# Patient Record
Sex: Female | Born: 1985
Health system: Southern US, Community
[De-identification: ages and names within clinical notes are randomized; demographics above are authoritative.]

## PROBLEM LIST (undated history)

## (undated) DIAGNOSIS — H539 Unspecified visual disturbance: Secondary | ICD-10-CM

## (undated) DIAGNOSIS — G35D Multiple sclerosis, unspecified: Secondary | ICD-10-CM

## (undated) DIAGNOSIS — R519 Headache, unspecified: Secondary | ICD-10-CM

## (undated) DIAGNOSIS — R51 Headache: Secondary | ICD-10-CM

## (undated) DIAGNOSIS — G35 Multiple sclerosis: Secondary | ICD-10-CM

## (undated) DIAGNOSIS — I10 Essential (primary) hypertension: Secondary | ICD-10-CM

## (undated) HISTORY — DX: Unspecified visual disturbance: H53.9

## (undated) HISTORY — DX: Multiple sclerosis, unspecified: G35.D

## (undated) HISTORY — DX: Headache, unspecified: R51.9

## (undated) HISTORY — DX: Headache: R51

## (undated) HISTORY — PX: CERVICAL CONE BIOPSY: SUR198

## (undated) HISTORY — DX: Multiple sclerosis: G35

---
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
## 2005-06-06 ENCOUNTER — Other Ambulatory Visit: Admission: RE | Admit: 2005-06-06 | Discharge: 2005-06-06 | Payer: Self-pay | Admitting: Family Medicine

## 2006-06-01 ENCOUNTER — Other Ambulatory Visit: Admission: RE | Admit: 2006-06-01 | Discharge: 2006-06-01 | Payer: Self-pay | Admitting: Family Medicine

## 2007-06-03 ENCOUNTER — Other Ambulatory Visit: Admission: RE | Admit: 2007-06-03 | Discharge: 2007-06-03 | Payer: Self-pay | Admitting: Family Medicine

## 2008-08-31 ENCOUNTER — Other Ambulatory Visit: Admission: RE | Admit: 2008-08-31 | Discharge: 2008-08-31 | Payer: Self-pay | Admitting: Family Medicine

## 2008-09-17 ENCOUNTER — Emergency Department (HOSPITAL_COMMUNITY): Admission: EM | Admit: 2008-09-17 | Discharge: 2008-09-17 | Payer: Self-pay | Admitting: Family Medicine

## 2008-11-22 ENCOUNTER — Emergency Department (HOSPITAL_COMMUNITY): Admission: EM | Admit: 2008-11-22 | Discharge: 2008-11-22 | Payer: Self-pay | Admitting: Emergency Medicine

## 2009-09-07 ENCOUNTER — Other Ambulatory Visit
Admission: RE | Admit: 2009-09-07 | Discharge: 2009-09-07 | Payer: Self-pay | Source: Home / Self Care | Admitting: Family Medicine

## 2010-02-16 ENCOUNTER — Inpatient Hospital Stay (HOSPITAL_COMMUNITY)
Admission: AD | Admit: 2010-02-16 | Discharge: 2010-02-18 | Payer: Self-pay | Source: Home / Self Care | Attending: Obstetrics and Gynecology | Admitting: Obstetrics and Gynecology

## 2010-02-16 ENCOUNTER — Encounter (INDEPENDENT_AMBULATORY_CARE_PROVIDER_SITE_OTHER): Payer: Self-pay | Admitting: Obstetrics and Gynecology

## 2010-02-18 ENCOUNTER — Encounter
Admission: RE | Admit: 2010-02-18 | Discharge: 2010-03-19 | Payer: Self-pay | Source: Home / Self Care | Attending: Obstetrics and Gynecology | Admitting: Obstetrics and Gynecology

## 2010-04-29 LAB — CBC
Platelets: 221 10*3/uL (ref 150–400)
Platelets: 235 10*3/uL (ref 150–400)
RBC: 4.18 MIL/uL (ref 3.87–5.11)
RBC: 4.59 MIL/uL (ref 3.87–5.11)
RDW: 14 % (ref 11.5–15.5)
WBC: 12.9 10*3/uL — ABNORMAL HIGH (ref 4.0–10.5)
WBC: 17 10*3/uL — ABNORMAL HIGH (ref 4.0–10.5)

## 2010-04-29 LAB — URINE MICROSCOPIC-ADD ON

## 2010-04-29 LAB — URINALYSIS, ROUTINE W REFLEX MICROSCOPIC
Bilirubin Urine: NEGATIVE
Glucose, UA: NEGATIVE mg/dL
Protein, ur: NEGATIVE mg/dL
Specific Gravity, Urine: 1.02 (ref 1.005–1.030)

## 2010-05-13 ENCOUNTER — Ambulatory Visit (HOSPITAL_COMMUNITY)
Admission: RE | Admit: 2010-05-13 | Discharge: 2010-05-13 | Disposition: A | Discharge status: Home / Self Care | Payer: Self-pay | Source: Ambulatory Visit | Attending: Infectious Diseases | Admitting: Infectious Diseases

## 2010-05-13 DIAGNOSIS — A4902 Methicillin resistant Staphylococcus aureus infection, unspecified site: Secondary | ICD-10-CM | POA: Insufficient documentation

## 2010-05-13 LAB — SURGICAL PCR SCREEN: Staphylococcus aureus: POSITIVE — AB

## 2010-10-16 ENCOUNTER — Other Ambulatory Visit: Payer: Self-pay | Admitting: Obstetrics and Gynecology

## 2010-10-16 ENCOUNTER — Other Ambulatory Visit (HOSPITAL_COMMUNITY)
Admission: RE | Admit: 2010-10-16 | Discharge: 2010-10-16 | Disposition: A | Discharge status: Home / Self Care | Payer: Federal, State, Local not specified - PPO | Source: Ambulatory Visit | Attending: Obstetrics and Gynecology | Admitting: Obstetrics and Gynecology

## 2010-10-16 DIAGNOSIS — Z113 Encounter for screening for infections with a predominantly sexual mode of transmission: Secondary | ICD-10-CM | POA: Insufficient documentation

## 2010-10-16 DIAGNOSIS — Z01419 Encounter for gynecological examination (general) (routine) without abnormal findings: Secondary | ICD-10-CM | POA: Insufficient documentation

## 2011-11-07 ENCOUNTER — Other Ambulatory Visit: Payer: Self-pay | Admitting: Obstetrics and Gynecology

## 2011-11-07 ENCOUNTER — Other Ambulatory Visit (HOSPITAL_COMMUNITY)
Admission: RE | Admit: 2011-11-07 | Discharge: 2011-11-07 | Disposition: A | Discharge status: Home / Self Care | Payer: Federal, State, Local not specified - PPO | Source: Ambulatory Visit | Attending: Obstetrics and Gynecology | Admitting: Obstetrics and Gynecology

## 2011-11-07 DIAGNOSIS — Z01419 Encounter for gynecological examination (general) (routine) without abnormal findings: Secondary | ICD-10-CM | POA: Insufficient documentation

## 2012-11-12 ENCOUNTER — Other Ambulatory Visit (HOSPITAL_COMMUNITY)
Admission: RE | Admit: 2012-11-12 | Discharge: 2012-11-12 | Disposition: A | Discharge status: Home / Self Care | Payer: BC Managed Care – PPO | Source: Ambulatory Visit | Attending: Family Medicine | Admitting: Family Medicine

## 2012-11-12 ENCOUNTER — Other Ambulatory Visit: Payer: Self-pay | Admitting: Physician Assistant

## 2012-11-12 DIAGNOSIS — Z124 Encounter for screening for malignant neoplasm of cervix: Secondary | ICD-10-CM | POA: Insufficient documentation

## 2014-09-19 ENCOUNTER — Other Ambulatory Visit: Payer: Self-pay | Admitting: Obstetrics and Gynecology

## 2014-09-19 ENCOUNTER — Other Ambulatory Visit (HOSPITAL_COMMUNITY)
Admission: RE | Admit: 2014-09-19 | Discharge: 2014-09-19 | Disposition: A | Discharge status: Home / Self Care | Payer: BC Managed Care – PPO | Source: Ambulatory Visit | Attending: Obstetrics and Gynecology | Admitting: Obstetrics and Gynecology

## 2014-09-19 DIAGNOSIS — Z113 Encounter for screening for infections with a predominantly sexual mode of transmission: Secondary | ICD-10-CM | POA: Insufficient documentation

## 2014-09-19 DIAGNOSIS — Z01411 Encounter for gynecological examination (general) (routine) with abnormal findings: Secondary | ICD-10-CM | POA: Diagnosis not present

## 2014-09-20 LAB — CYTOLOGY - PAP

## 2015-08-03 ENCOUNTER — Other Ambulatory Visit (HOSPITAL_COMMUNITY): Payer: Self-pay | Admitting: Obstetrics and Gynecology

## 2015-08-03 DIAGNOSIS — O3680X0 Pregnancy with inconclusive fetal viability, not applicable or unspecified: Secondary | ICD-10-CM

## 2015-08-16 ENCOUNTER — Ambulatory Visit (HOSPITAL_COMMUNITY): Payer: BC Managed Care – PPO

## 2015-08-17 ENCOUNTER — Ambulatory Visit (HOSPITAL_COMMUNITY)
Admission: RE | Admit: 2015-08-17 | Discharge: 2015-08-17 | Disposition: A | Discharge status: Home / Self Care | Payer: BC Managed Care – PPO | Source: Ambulatory Visit | Attending: Obstetrics and Gynecology | Admitting: Obstetrics and Gynecology

## 2015-08-17 DIAGNOSIS — Z3A01 Less than 8 weeks gestation of pregnancy: Secondary | ICD-10-CM | POA: Diagnosis not present

## 2015-08-17 DIAGNOSIS — Z8759 Personal history of other complications of pregnancy, childbirth and the puerperium: Secondary | ICD-10-CM | POA: Diagnosis not present

## 2015-08-17 DIAGNOSIS — Z36 Encounter for antenatal screening of mother: Secondary | ICD-10-CM | POA: Insufficient documentation

## 2015-08-17 DIAGNOSIS — O3680X0 Pregnancy with inconclusive fetal viability, not applicable or unspecified: Secondary | ICD-10-CM | POA: Insufficient documentation

## 2017-03-20 ENCOUNTER — Ambulatory Visit
Admission: RE | Admit: 2017-03-20 | Discharge: 2017-03-20 | Disposition: A | Discharge status: Home / Self Care | Payer: BC Managed Care – PPO | Source: Ambulatory Visit | Attending: Physician Assistant | Admitting: Physician Assistant

## 2017-03-20 ENCOUNTER — Other Ambulatory Visit: Payer: Self-pay | Admitting: Physician Assistant

## 2017-03-20 DIAGNOSIS — R51 Headache: Principal | ICD-10-CM

## 2017-03-20 DIAGNOSIS — R519 Headache, unspecified: Secondary | ICD-10-CM

## 2017-03-31 ENCOUNTER — Other Ambulatory Visit: Payer: Self-pay | Admitting: Obstetrics and Gynecology

## 2017-03-31 ENCOUNTER — Other Ambulatory Visit (HOSPITAL_COMMUNITY)
Admission: RE | Admit: 2017-03-31 | Discharge: 2017-03-31 | Disposition: A | Discharge status: Home / Self Care | Payer: BC Managed Care – PPO | Source: Ambulatory Visit | Attending: Obstetrics and Gynecology | Admitting: Obstetrics and Gynecology

## 2017-03-31 ENCOUNTER — Encounter: Payer: Self-pay | Admitting: Neurology

## 2017-03-31 DIAGNOSIS — Z124 Encounter for screening for malignant neoplasm of cervix: Secondary | ICD-10-CM | POA: Insufficient documentation

## 2017-04-01 LAB — CYTOLOGY - PAP
Diagnosis: NEGATIVE
HPV (WINDOPATH): NOT DETECTED

## 2017-07-15 ENCOUNTER — Encounter

## 2017-07-15 ENCOUNTER — Ambulatory Visit: Payer: BC Managed Care – PPO | Admitting: Neurology

## 2017-07-15 DIAGNOSIS — G35A Relapsing-remitting multiple sclerosis: Secondary | ICD-10-CM | POA: Insufficient documentation

## 2017-07-15 DIAGNOSIS — G35 Multiple sclerosis: Secondary | ICD-10-CM | POA: Insufficient documentation

## 2017-10-01 ENCOUNTER — Encounter: Payer: Self-pay | Admitting: Neurology

## 2017-10-01 ENCOUNTER — Other Ambulatory Visit: Payer: Self-pay

## 2017-10-01 ENCOUNTER — Ambulatory Visit: Payer: BC Managed Care – PPO | Admitting: Neurology

## 2017-10-01 VITALS — BP 146/98 | HR 73 | Resp 14 | Ht 62.0 in | Wt 154.0 lb

## 2017-10-01 DIAGNOSIS — G35 Multiple sclerosis: Secondary | ICD-10-CM | POA: Diagnosis not present

## 2017-10-01 DIAGNOSIS — F09 Unspecified mental disorder due to known physiological condition: Secondary | ICD-10-CM | POA: Diagnosis not present

## 2017-10-01 DIAGNOSIS — R3915 Urgency of urination: Secondary | ICD-10-CM

## 2017-10-01 DIAGNOSIS — R269 Unspecified abnormalities of gait and mobility: Secondary | ICD-10-CM | POA: Insufficient documentation

## 2017-10-01 DIAGNOSIS — G35D Multiple sclerosis, unspecified: Secondary | ICD-10-CM | POA: Insufficient documentation

## 2017-10-01 MED ORDER — AMPHETAMINE-DEXTROAMPHET ER 15 MG PO CP24: 15.0000 mg | ORAL_CAPSULE | ORAL | 0 refills | Status: DC

## 2017-10-01 NOTE — Progress Notes (Signed)
[facility]  PATIENT: Marissa Andersen DOB: 07-27-1985  REFERRING DOCTOR OR PCP:  Dr. Barrie Folk SOURCE: Patient, notes from Dr. Nestor Ramp, imaging and lab reports, MRI images personally reviewed on PACS.  _________________________________   HISTORICAL  CHIEF COMPLAINT:  Chief Complaint  Patient presents with  . Multiple Sclerosis    Marissa is here for a 2nd opinion regarding MS and tx. options.  Currently seeing Dr. Nestor Ramp at [facility].  "I just want to make sure I'm on the right track."  Sts. dx. 07/09/17.  Presenting sx. was h/a's, dizziness, optic neuritis, right sided facial numbness.  MRI brain, c-spine and t-spine done. No LP was needed. She had 5 days of IV Solumedrol, then started Ocrevus. She had parts A and B of 1st infusion in June 2019.  Facial numbness has resolved. Sts. vision is back to baseline.  She now only c/o   . Fatigue    fatigue.  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  No fam hx. of MS/fim    HISTORY OF PRESENT ILLNESS:  I had the pleasure seeing patient, Marissa Andersen, at the [place] center at  [facility] for neurologic consultation regarding her recent diagnosis of multiple sclerosis.    She is a 32 year old woman who was diagnosed with MS in May 2019  She woke up with a severe headache in January that did not improve.    Then she had dizziness and nausea in January and February 2019.    Her smile seemed distorted and she had riht facial numbness in February 2019.    Around March, she noted difficulty with vision out of the right eye followed by diplopia. In [place] PCP first started to treat her for migraine.   When symptoms persisted, she had a head CT which was read as normal (in retrospect, there is one focus on the left).  At some point, she noted increased urinary frequency,  She saw Dr. Nestor Ramp May 2019.   An MRI was ordered in late May 2019 and she was diagnosed with MS based on the  pattern and that multiple lesions enhanced.   She did feel clumsy and fall once in June or July.    She received 5 days of IV Solumedrol in late MAy, early June.     She was prescribed Ocrevus and had the split dose 08/13/2017 and 08/28/2017.      She feels gait is doing well and is back to baseline.   Strength is back to baseline but she notes she fatigue more quickly climbing stairs.    She denies numbness.   Bladder function is better but she still notes urgency.   No recent incontinence.   Vision is fine now.    Diplopia resolved.    She has had a lot of fatigue the past few months.   She doesn't sleep well some nights when her husband is out of town (travels every week).    Mood is doing ok.  She denies depression but has been sad a few time.   She feels cognition is worse this year with reduced focus/attention and spme verbal fluency issues.    She is otherwise fairly healthy with benign essential hypertension treated with losartan.   She has no FH of MS.    I personally reviewed the MRIs of the brain and spine performed 07/09/2017 and 07/28/2017.  The MRI of the brain shows multiple T2/FLAIR hyperintense foci in  the hemispheres as well in the posterior pons.  About 7-8 of the lesions were enhancing after contrast.  The largest focus is in the left parietal lobe.    MRI of the cervical and thoracic spine also showed foci.  Located at C2, T1, T5 and T11.  None of them and enhanced.   She also had lab work in May and June 2019.  The JCV antibody was positive at 1.56.  The neuromyelitis optica antibody was negative.  REVIEW OF SYSTEMS: Constitutional: No fevers, chills, sweats, or change in appetite.   She has some fatigue. Eyes: No visual changes, double vision, eye pain Ear, nose and throat: No hearing loss, ear pain, nasal congestion, sore throat Cardiovascular: No chest pain, palpitations Respiratory: No shortness of breath at rest or with exertion.   No wheezes GastrointestinaI: No nausea,  vomiting, diarrhea, abdominal pain, fecal incontinence Genitourinary:She has urinary urgency and frequency.  No incontinence.   Musculoskeletal: No neck pain, back pain Integumentary: No rash, pruritus, skin lesions Neurological: as above Psychiatric: No depression at this time but has felt a little sad since her diagnosis..  No anxiety Endocrine: No palpitations, diaphoresis, change in appetite, change in weigh or increased thirst Hematologic/Lymphatic: No anemia, purpura, petechiae. Allergic/Immunologic: No itchy/runny eyes, nasal congestion, recent allergic reactions, rashes  ALLERGIES: No Known Allergies  HOME MEDICATIONS:  Current Outpatient Medications:  .  losartan (COZAAR) 25 MG tablet, Take 25 mg by mouth daily., Disp: , Rfl:  .  ocrelizumab 600 mg in sodium chloride 0.9 % 500 mL, Inject 600 mg into the vein every 6 (six) months., Disp: , Rfl:  .  amphetamine-dextroamphetamine (ADDERALL XR) 15 MG 24 hr capsule, Take 1 capsule by mouth every morning., Disp: 30 capsule, Rfl: 0  PAST MEDICAL HISTORY: Past Medical History:  Diagnosis Date  . Headache   . Multiple sclerosis ([place])   . Vision abnormalities     PAST SURGICAL HISTORY: Past Surgical History:  Procedure Laterality Date  . CERVICAL CONE BIOPSY      FAMILY HISTORY: Family History  Problem Relation Age of Onset  . Hypertension Mother   . Diabetes type II Mother   . Multiple myeloma Mother   . Hypertension Father   . Healthy Sister   . Healthy Brother   . Diabetes Maternal Grandmother   . Diabetes Maternal Grandfather   . Hypertension Paternal Grandmother   . Dementia Paternal Grandmother     SOCIAL HISTORY:  Social History   Socioeconomic History  . Marital status: Married    Spouse name: Not on file  . Number of children: Not on file  . Years of education: Not on file  . Highest education level: Not on file  Occupational History  . Not on file  Social Needs  . Financial resource strain:  Not on file  . Food insecurity:    Worry: Not on file    Inability: Not on file  . Transportation needs:    Medical: Not on file    Non-medical: Not on file  Tobacco Use  . Smoking status: Former Research scientist (life sciences)  . Smokeless tobacco: Never Used  Substance and Sexual Activity  . Alcohol use: Not Currently  . Drug use: Never  . Sexual activity: Not on file  Lifestyle  . Physical activity:    Days per week: Not on file    Minutes per session: Not on file  . Stress: Not on file  Relationships  . Social connections:    Talks on  phone: Not on file    Gets together: Not on file    Attends religious service: Not on file    Active member of club or organization: Not on file    Attends meetings of clubs or organizations: Not on file    Relationship status: Not on file  . Intimate partner violence:    Fear of current or ex partner: Not on file    Emotionally abused: Not on file    Physically abused: Not on file    Forced sexual activity: Not on file  Other Topics Concern  . Not on file  Social History Narrative  . Not on file     PHYSICAL EXAM  Vitals:   10/01/17 1345  BP: (!) 146/98  Pulse: 73  Resp: 14  Weight: 154 lb (69.9 kg)  Height: '5\' 2"'  (1.575 m)    Body mass index is 28.17 kg/m.   General: The patient is well-developed and well-nourished and in no acute distress  Eyes:  Funduscopic exam shows normal optic discs and retinal vessels.  Neck: The neck is supple, no carotid bruits are noted.  The neck is nontender.  Cardiovascular: The heart has a regular rate and rhythm with a normal S1 and S2. There were no murmurs, gallops or rubs. Lungs are clear to auscultation.  Skin: Extremities are without significant edema.  Musculoskeletal:  Back is nontender  Neurologic Exam  Mental status: The patient is alert and oriented x 3 at the time of the examination. The patient has apparent normal recent and remote memory, with an apparently normal attention span and  concentration ability.   Speech is normal.  Cranial nerves: Extraocular movements are full. Pupils are equal, round, and reactive to light and accomodation.  Visual fields are full.  Facial symmetry is present. There is good facial sensation to soft touch bilaterally.Facial strength is normal.  Trapezius and sternocleidomastoid strength is normal. No dysarthria is noted.  The tongue is midline, and the patient has symmetric elevation of the soft palate. No obvious hearing deficits are noted.  Motor:  Muscle bulk is normal.   Tone is normal. Strength is  5 / 5 in all 4 extremities.   Sensory: Sensory testing is intact to pinprick, soft touch and vibration sensation in all 4 extremities.  Coordination: Cerebellar testing reveals good finger-nose-finger and heel-to-shin bilaterally.  Gait and station: Station is normal.   Gait is normal. Tandem gait is mildly wide. Romberg is negative.   Reflexes: Deep tendon reflexes are symmetric and normal bilaterally.   Plantar responses are flexor.    DIAGNOSTIC DATA (LABS, IMAGING, TESTING) - I reviewed patient records, labs, notes, testing and imaging myself where available.  Lab Results  Component Value Date   WBC 12.9 (H) 02/17/2010   HGB 11.6 (L) 02/17/2010   HCT 35.0 (L) 02/17/2010   MCV 83.7 02/17/2010   PLT 221 02/17/2010      ASSESSMENT AND PLAN  Multiple sclerosis (HCC)  Cognitive deficit secondary to multiple sclerosis (HCC)  Gait disturbance  Urinary urgency  In summary, [name] is a 32 year old woman with a recent diagnosis of MS.  I discussed with her that the MRI shows that she does have fairly aggressive features.  Specifically, she had multiple enhancing lesions at presentation and has 4 spinal cord plaques and one large plaque in the brainstem.  Fortunately she has recovered fairly well and has only mild deficits at this current time.  I concur with the use of Ocrevus  as a highly effective disease modifying therapy for MS  and I reviewed the safety issues with her again.  She wishes to change over to our practice and her next infusion will be in the second half of December.  Symptomatically, she is noting a fair amount of fatigue and some mild cognitive issues including reduced attention.  I will send in a prescription for Adderall and we can adjust the dose as needed.  She will return to see me in December or call sooner if there are new or worsening neurologic symptoms.  Thank you for asking me to see [name].  Please let me know if I can be of further assistance with her or other patients in the future.   Richard A. Felecia Shelling, MD, PhD, FAAN Certified in Neurology, Clinical Neurophysiology, Sleep Medicine, Pain Medicine and Neuroimaging Director, [place] [facility] [address], [place], [place] [address] [phone]

## 2017-10-02 ENCOUNTER — Encounter: Payer: Self-pay | Admitting: *Deleted

## 2017-10-05 ENCOUNTER — Telehealth: Payer: Self-pay | Admitting: *Deleted

## 2017-10-05 NOTE — Telephone Encounter (Signed)
Fax received from [facility], phone# [phone].   Adderall PA approved for dates 10/05/17 thru 10/05/20.  PA# [facility] Plan [phone] non-Grandfathered [id number] DH/fim

## 2017-10-05 NOTE — Telephone Encounter (Signed)
PA for Amphetamine-Dextroamphetamne ER 15mg  #30/30 completed via Cover My Meds. Key# [id number].  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

## 2017-11-06 ENCOUNTER — Encounter (HOSPITAL_COMMUNITY): Payer: Self-pay | Admitting: Emergency Medicine

## 2017-11-06 ENCOUNTER — Emergency Department (HOSPITAL_COMMUNITY): Payer: BC Managed Care – PPO

## 2017-11-06 ENCOUNTER — Emergency Department (HOSPITAL_COMMUNITY)
Admission: EM | Admit: 2017-11-06 | Discharge: 2017-11-06 | Disposition: A | Discharge status: Home / Self Care | Payer: BC Managed Care – PPO | Attending: Emergency Medicine | Admitting: Emergency Medicine

## 2017-11-06 ENCOUNTER — Other Ambulatory Visit: Payer: Self-pay

## 2017-11-06 DIAGNOSIS — F419 Anxiety disorder, unspecified: Secondary | ICD-10-CM | POA: Diagnosis not present

## 2017-11-06 DIAGNOSIS — R002 Palpitations: Secondary | ICD-10-CM

## 2017-11-06 DIAGNOSIS — Z87891 Personal history of nicotine dependence: Secondary | ICD-10-CM | POA: Diagnosis not present

## 2017-11-06 LAB — I-STAT CHEM 8, ED
BUN: 8 mg/dL (ref 6–20)
CREATININE: 0.8 mg/dL (ref 0.44–1.00)
Calcium, Ion: 1.14 mmol/L — ABNORMAL LOW (ref 1.15–1.40)
Chloride: 103 mmol/L (ref 98–111)
GLUCOSE: 91 mg/dL (ref 70–99)
HEMATOCRIT: 43 % (ref 36.0–46.0)
HEMOGLOBIN: 14.6 g/dL (ref 12.0–15.0)
Potassium: 3.1 mmol/L — ABNORMAL LOW (ref 3.5–5.1)
Sodium: 140 mmol/L (ref 135–145)
TCO2: 25 mmol/L (ref 22–32)

## 2017-11-06 LAB — CBC WITH DIFFERENTIAL/PLATELET
Abs Immature Granulocytes: 0 10*3/uL (ref 0.0–0.1)
BASOS PCT: 1 %
Basophils Absolute: 0.1 10*3/uL (ref 0.0–0.1)
EOS ABS: 0.2 10*3/uL (ref 0.0–0.7)
EOS PCT: 3 %
HEMATOCRIT: 43.7 % (ref 36.0–46.0)
Hemoglobin: 13.9 g/dL (ref 12.0–15.0)
Immature Granulocytes: 0 %
Lymphocytes Relative: 27 %
Lymphs Abs: 1.5 10*3/uL (ref 0.7–4.0)
MCH: 27.4 pg (ref 26.0–34.0)
MCHC: 31.8 g/dL (ref 30.0–36.0)
MCV: 86 fL (ref 78.0–100.0)
Monocytes Absolute: 0.6 10*3/uL (ref 0.1–1.0)
Monocytes Relative: 11 %
NEUTROS PCT: 58 %
Neutro Abs: 3.2 10*3/uL (ref 1.7–7.7)
PLATELETS: 288 10*3/uL (ref 150–400)
RBC: 5.08 MIL/uL (ref 3.87–5.11)
RDW: 11.9 % (ref 11.5–15.5)
WBC: 5.5 10*3/uL (ref 4.0–10.5)

## 2017-11-06 LAB — I-STAT TROPONIN, ED
TROPONIN I, POC: 0 ng/mL (ref 0.00–0.08)
Troponin i, poc: 0 ng/mL (ref 0.00–0.08)

## 2017-11-06 LAB — I-STAT BETA HCG BLOOD, ED (MC, WL, AP ONLY)

## 2017-11-06 LAB — D-DIMER, QUANTITATIVE (NOT AT ARMC)

## 2017-11-06 NOTE — ED Triage Notes (Signed)
Pt to ED via EMS from home. 45 mins ago began having chest tightness that she describes as dull pain, worse with inhalation. Hx anxiety, diagnosed with MS in May. Sts she has never had panic attack, but pain has subsided, heart rate and blood pressure are back WDL at present whereas pt was hypertensive and tachycardic on EMS arrival. 324 ASA given PTA.

## 2017-11-06 NOTE — ED Provider Notes (Signed)
[place] EMERGENCY DEPARTMENT Provider Note   CSN: [id number] Arrival date & time: 11/06/17  0204     History   Chief Complaint Chief Complaint  Patient presents with  . Chest Pain    HPI Marissa Andersen is a 32 y.o. female.  The history is provided by the patient. No language interpreter was used.  Chest Pain   This is a new problem. The current episode started 1 to 2 hours ago. The problem occurs constantly. The problem has not changed since onset.Associated with: being upset  The pain is present in the substernal region. The pain is moderate. The quality of the pain is described as dull. The pain does not radiate. Associated symptoms include palpitations. Pertinent negatives include no abdominal pain, no cough, no diaphoresis, no syncope and no vomiting. She has tried nothing for the symptoms. The treatment provided significant relief. There are no known risk factors.  Pertinent negatives for past medical history include no MI.  Pertinent negatives for family medical history include: no Marfan's syndrome.  Procedure history is negative for cardiac catheterization.  Was having a discussion with her husband regarding medical issues and began having palpitations CP sob.  Those resolved.    Past Medical History:  Diagnosis Date  . Headache   . Multiple sclerosis ([place])   . Vision abnormalities     Patient Active Problem List   Diagnosis Date Noted  . 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 Gait disturbance 10/01/2017  . Multiple sclerosis ([place]) 07/15/2017    Past Surgical History:  Procedure Laterality Date  . CERVICAL CONE BIOPSY       OB History   None      Home Medications    Prior to Admission medications   Medication Sig Start Date End Date Taking? Authorizing Provider  amphetamine-dextroamphetamine (ADDERALL XR) 15 MG 24 hr capsule Take 1 capsule by mouth every morning. Patient not taking: Reported on  11/06/2017 10/01/17   Britt Bottom, MD    Family History Family History  Problem Relation Age of Onset  . Hypertension Mother   . Diabetes type II Mother   . Multiple myeloma Mother   . Hypertension Father   . Healthy Sister   . Healthy Brother   . Diabetes Maternal Grandmother   . Diabetes Maternal Grandfather   . Hypertension Paternal Grandmother   . Dementia Paternal Grandmother     Social History Social History   Tobacco Use  . Smoking status: Former Research scientist (life sciences)  . Smokeless tobacco: Never Used  Substance Use Topics  . Alcohol use: Not Currently  . Drug use: Never     Allergies   Patient has no known allergies.   Review of Systems Review of Systems  Constitutional: Negative for diaphoresis.  Respiratory: Negative for cough.   Cardiovascular: Positive for chest pain and palpitations. Negative for leg swelling and syncope.  Gastrointestinal: Negative for abdominal pain and vomiting.  Psychiatric/Behavioral: The patient is nervous/anxious.   All other systems reviewed and are negative.    Physical Exam Updated Vital Signs BP (!) 140/92   Pulse 75   Temp (!) 97.5 F (36.4 C) (Oral)   Resp 13   Ht '5\' 2"'  (1.575 m)   Wt 68 kg   SpO2 100%   BMI 27.44 kg/m   Physical Exam  Constitutional: She is oriented to person, place, and time. She appears well-developed and well-nourished. No distress.  HENT:  Head: Normocephalic and atraumatic.  Mouth/Throat: No oropharyngeal exudate.  Eyes: Pupils are equal, round, and reactive to light. Conjunctivae are normal.  Neck: Normal range of motion. Neck supple.  Cardiovascular: Normal rate, regular rhythm, normal heart sounds and intact distal pulses.  Pulmonary/Chest: Effort normal and breath sounds normal. No stridor. She has no wheezes. She has no rales. She exhibits no tenderness.  Abdominal: Soft. Bowel sounds are normal. She exhibits no mass. There is no tenderness. There is no rebound and no guarding.    Musculoskeletal: Normal range of motion.  Neurological: She is alert and oriented to person, place, and time. She displays normal reflexes.  Skin: Skin is warm and dry. Capillary refill takes less than 2 seconds.  Psychiatric: She has a normal mood and affect.     ED Treatments / Results  Labs (all labs ordered are listed, but only abnormal results are displayed) Results for orders placed or performed during the hospital encounter of 11/06/17  CBC with Differential/Platelet  Result Value Ref Range   WBC 5.5 4.0 - 10.5 K/uL   RBC 5.08 3.87 - 5.11 MIL/uL   Hemoglobin 13.9 12.0 - 15.0 g/dL   HCT 43.7 36.0 - 46.0 %   MCV 86.0 78.0 - 100.0 fL   MCH 27.4 26.0 - 34.0 pg   MCHC 31.8 30.0 - 36.0 g/dL   RDW 11.9 11.5 - 15.5 %   Platelets 288 150 - 400 K/uL   Neutrophils Relative % 58 %   Neutro Abs 3.2 1.7 - 7.7 K/uL   Lymphocytes Relative 27 %   Lymphs Abs 1.5 0.7 - 4.0 K/uL   Monocytes Relative 11 %   Monocytes Absolute 0.6 0.1 - 1.0 K/uL   Eosinophils Relative 3 %   Eosinophils Absolute 0.2 0.0 - 0.7 K/uL   Basophils Relative 1 %   Basophils Absolute 0.1 0.0 - 0.1 K/uL   Immature Granulocytes 0 %   Abs Immature Granulocytes 0.0 0.0 - 0.1 K/uL  D-dimer, quantitative (not at [facility])  Result Value Ref Range   D-Dimer, Quant <0.27 0.00 - 0.50 ug/mL-FEU  I-stat troponin, ED  Result Value Ref Range   Troponin i, poc 0.00 0.00 - 0.08 ng/mL   Comment 3          I-Stat beta hCG blood, ED  Result Value Ref Range   I-stat hCG, quantitative <5.0 <5 mIU/mL   Comment 3          I-Stat Chem 8, ED  Result Value Ref Range   Sodium 140 135 - 145 mmol/L   Potassium 3.1 (L) 3.5 - 5.1 mmol/L   Chloride 103 98 - 111 mmol/L   BUN 8 6 - 20 mg/dL   Creatinine, Ser 0.80 0.44 - 1.00 mg/dL   Glucose, Bld 91 70 - 99 mg/dL   Calcium, Ion 1.14 (L) 1.15 - 1.40 mmol/L   TCO2 25 22 - 32 mmol/L   Hemoglobin 14.6 12.0 - 15.0 g/dL   HCT 43.0 36.0 - 46.0 %  I-Stat Troponin, ED (not at [facility])  Result Value  Ref Range   Troponin i, poc 0.00 0.00 - 0.08 ng/mL   Comment 3           Dg Chest 2 View  Result Date: 11/06/2017 CLINICAL DATA:  Chest tightness for 45 minutes. Recent diagnosis of multiple sclerosis in May. EXAM: CHEST - 2 VIEW COMPARISON:  None. FINDINGS: The heart size and mediastinal contours are within normal limits. Both lungs are clear. The visualized skeletal structures are  unremarkable. IMPRESSION: No active cardiopulmonary disease. Electronically Signed   By: Lucienne Capers M.D.   On: 11/06/2017 02:43    EKG EKG Interpretation  Date/Time:  Friday November 06 2017 02:15:48 EDT Ventricular Rate:  76 PR Interval:    QRS Duration: 90 QT Interval:  369 QTC Calculation: 415 R Axis:   74 Text Interpretation:  Sinus rhythm Baseline wander in lead(s) V3 Otherwise within normal limits No old tracing to compare Confirmed by Delora Fuel ([id number]) on 11/06/2017 2:19:40 AM   Radiology Dg Chest 2 View  Result Date: 11/06/2017 CLINICAL DATA:  Chest tightness for 45 minutes. Recent diagnosis of multiple sclerosis in May. EXAM: CHEST - 2 VIEW COMPARISON:  None. FINDINGS: The heart size and mediastinal contours are within normal limits. Both lungs are clear. The visualized skeletal structures are unremarkable. IMPRESSION: No active cardiopulmonary disease. Electronically Signed   By: Lucienne Capers M.D.   On: 11/06/2017 02:43    Procedures Procedures (including critical care time)    Final Clinical Impressions(s) / ED Diagnoses   Symptoms consistent with panic attack.  Ruled out for MI and PE in the ED.    Return for fevers >100.4 unrelieved by medication, shortness of breath, intractable vomiting, or diarrhea, Inability to tolerate liquids or food, cough, altered mental status or any concerns. No signs of systemic illness or infection. The patient is nontoxic-appearing on exam and vital signs are within normal limits.   I have reviewed the triage vital signs and the nursing  notes. Pertinent labs &imaging results that were available during my care of the patient were reviewed by me and considered in my medical decision making (see chart for details).  After history, exam, and medical workup I feel the patient has been appropriately medically screened and is safe for discharge home. Pertinent diagnoses were discussed with the patient. Patient was given return precautions.      Jere Vanburen, MD 11/06/17 [id number]

## 2017-11-26 ENCOUNTER — Telehealth: Payer: Self-pay | Admitting: Neurology

## 2017-11-26 NOTE — Telephone Encounter (Signed)
Spoke with Marissa Andersen and explained she can have a flu shot--dead vaccine only--and should get the vaccine at least 6 weeks prior to her Ocrevus infusion.  It will not hurt her if she has the vaccine w/i 6 wks of Ocrevus, but the Ocrevus will render the vaccine less effective.  She verbalized understanding of same/fim

## 2017-11-26 NOTE — Telephone Encounter (Signed)
Pt requesting a call stating she is unsure if/when she can get her flu shot. She does not want it to interfere with infusions. Please advise

## 2017-11-30 ENCOUNTER — Other Ambulatory Visit: Payer: Self-pay | Admitting: Physician Assistant

## 2017-11-30 ENCOUNTER — Other Ambulatory Visit (HOSPITAL_COMMUNITY)
Admission: RE | Admit: 2017-11-30 | Discharge: 2017-11-30 | Disposition: A | Discharge status: Home / Self Care | Payer: BC Managed Care – PPO | Source: Ambulatory Visit | Attending: Family Medicine | Admitting: Family Medicine

## 2017-11-30 DIAGNOSIS — Z01419 Encounter for gynecological examination (general) (routine) without abnormal findings: Secondary | ICD-10-CM | POA: Insufficient documentation

## 2017-12-01 ENCOUNTER — Telehealth: Payer: Self-pay | Admitting: Neurology

## 2017-12-02 LAB — CYTOLOGY - PAP
ADEQUACY: ABSENT
DIAGNOSIS: NEGATIVE

## 2017-12-16 ENCOUNTER — Telehealth: Payer: Self-pay | Admitting: *Deleted

## 2017-12-16 NOTE — Telephone Encounter (Signed)
[place] MR BRAIN W & WO CONTRAST5/23/2019 [facility] Result Narrative  CLINICAL DATA:Rule out demyelinating disease.  EXAM: MRI HEAD WITHOUT AND WITH CONTRAST  TECHNIQUE: Multiplanar, multiecho pulse sequences of the brain and surrounding structures were obtained without and with intravenous contrast.  CONTRAST:13 mL MultiHance IV  COMPARISON:CT head 03/20/2017  FINDINGS: Brain: Multiple periventricular white matter lesions are present bilaterally. Many of these are perpendicular to the ventricular surface consistent with multiple sclerosis. Hyperintense signal in the floor of the fourth ventricle.  Postcontrast, multiple white matter lesions show enhancement. The enhancement pattern is that of partial ring-like enhancement, some lesion show mild enhancement others more intense enhancement. T2 shine through and multiple lesions without restricted diffusion.  Ventricle size is normal.Negative for hemorrhage.No mass lesion.  Vascular: Normal arterial flow voids.  Skull and upper cervical spine: Negative  Sinuses/Orbits: Mucous retention cyst left maxillary sinus. Normal orbit  Other: None  IMPRESSION: Numerous medium size periventricular white matter hyperintense lesions many which show partial enhancement consistent with multiple sclerosis. Hyperintense signal in the midbrain at the floor of the fourth ventricle also consistent with multiple sclerosis.   Electronically Signed ByMaye Hides M.D. On: 07/09/2017 15:21  Other Result Information  Interface, Rad Results In - 07/09/2017  3:23 PM EDT CLINICAL DATA:  Rule out demyelinating disease.  EXAM: MRI HEAD WITHOUT AND WITH CONTRAST  TECHNIQUE: Multiplanar, multiecho pulse sequences of the brain and surrounding structures were obtained without and with intravenous contrast.  CONTRAST:  13 mL MultiHance IV  COMPARISON:  CT head 03/20/2017  FINDINGS: Brain: Multiple  periventricular white matter lesions are present bilaterally. Many of these are perpendicular to the ventricular surface consistent with multiple sclerosis. Hyperintense signal in the floor of the fourth ventricle.  Postcontrast, multiple white matter lesions show enhancement. The enhancement pattern is that of partial ring-like enhancement, some lesion show mild enhancement others more intense enhancement. T2 shine through and multiple lesions without restricted diffusion.  Ventricle size is normal.  Negative for hemorrhage.  No mass lesion.  Vascular: Normal arterial flow voids.  Skull and upper cervical spine: Negative  Sinuses/Orbits: Mucous retention cyst left maxillary sinus. Normal orbit  Other: None  IMPRESSION: Numerous medium size periventricular white matter hyperintense lesions many which show partial enhancement consistent with multiple sclerosis. Hyperintense signal in the midbrain at the floor of the fourth ventricle also consistent

## 2017-12-16 NOTE — Telephone Encounter (Signed)
Hepatitis B Surface Antibody, QL    07/15/2017 07/15/2017   NONREACTIVE     HEP-B Core AB  07/15/2017  Non-Reactive  Hepatitis C Antibody  5/29/201Non-Reactive9  Hepatitis A (IGG and IGM) Antibody5/29/2019  Non-Reactive  Hepatitis B Surface Ant  5/29/2019Non-Reactive

## 2017-12-22 NOTE — Telephone Encounter (Signed)
Error

## 2018-02-05 ENCOUNTER — Encounter: Payer: Self-pay | Admitting: Neurology

## 2018-02-05 ENCOUNTER — Other Ambulatory Visit: Payer: Self-pay

## 2018-02-05 ENCOUNTER — Ambulatory Visit: Payer: BC Managed Care – PPO | Admitting: Neurology

## 2018-02-05 VITALS — BP 110/80 | HR 72 | Ht 62.0 in | Wt 153.5 lb

## 2018-02-05 DIAGNOSIS — F419 Anxiety disorder, unspecified: Secondary | ICD-10-CM

## 2018-02-05 DIAGNOSIS — G35 Multiple sclerosis: Secondary | ICD-10-CM | POA: Diagnosis not present

## 2018-02-05 DIAGNOSIS — F09 Unspecified mental disorder due to known physiological condition: Secondary | ICD-10-CM

## 2018-02-05 DIAGNOSIS — R269 Unspecified abnormalities of gait and mobility: Secondary | ICD-10-CM | POA: Diagnosis not present

## 2018-02-05 DIAGNOSIS — Z79899 Other long term (current) drug therapy: Secondary | ICD-10-CM

## 2018-02-05 MED ORDER — INDOMETHACIN 25 MG PO CAPS: 25.0000 mg | ORAL_CAPSULE | Freq: Two times a day (BID) | ORAL | 1 refills | Status: DC

## 2018-02-05 NOTE — Progress Notes (Signed)
[facility]  PATIENT: Marissa Andersen DOB: 1985/05/22  REFERRING DOCTOR OR PCP:  Dr. Barrie Folk SOURCE: Patient, notes from Dr. Nestor Ramp, imaging and lab reports, MRI images personally reviewed on PACS.  _________________________________   HISTORICAL  CHIEF COMPLAINT:  Chief Complaint  Patient presents with  . Follow-up    RM 12, alone. Last seen 10/01/17. Stopped adderall after taking for a week d/t anxiety attack  . Multiple Sclerosis    On Ocrevus. Next infusion: 02/11/18  . Headache    Headaches have come back in the last week. Has eletriptan to take prn from PCP. They gave her 6 tabs per month but she is needing to take more than this. She wants to discuss headaches.   . Joint Pain    Has achy joints, both knees. She lives on third floor.     HISTORY OF PRESENT ILLNESS:  Marissa Andersen is a 32 y.o. woman with multiple sclerosis.      Update 02/05/2018: Her first Ocrevus infusions were in late June and early July 2019.  She tolerated the infusions well.  She is scheduled to have her next infusion in about a week.  She denies any exacerbations.    Currently, her gait is doing well.  Strength returned to baseline from her initial exacerbation.  She fatigues easily.   She feels the legs are slightly numb.  She has some bladder urgency but no incontinence.  She does not note any difficulties with her vision.  She had some diplopia that resolved.  I had prescribed Adderall for fatigue and cognitive followed.  She was unable to tolerate it due to feeling more anxious and having a panic attack a few days after starting   She notes myalgias ans knee and back aches.   She notes anxiety in general more than depression.   She haas had only the one panic attack.   She reports migraine headaches the last 2 weeks but notes more stress with husband out of town most of that time.   HA is bilateral.   Pain is pressure like,  She has mild nausea but no vomiting.   No photophobia.      Moving does not change the intensity.    She was prescribed eletriptan and took 2 the one day with benefit and one yesterday with benefit.      From 10/01/2017: She is a 32 year old woman who was diagnosed with MS in May 2019.  She woke up with a severe headache in January that did not improve.    Then she had dizziness and nausea in January and February 2019.    Her smile seemed distorted and she had riht facial numbness in February 2019.    Around March, she noted difficulty with vision out of the right eye followed by diplopia. In [place] PCP first started to treat her for migraine.   When symptoms persisted, she had a head CT which was read as normal (in retrospect, there is one focus on the left).  At some point, she noted increased urinary frequency,  She saw Dr. Nestor Ramp May 2019.   An MRI was ordered in late May 2019 and she was diagnosed with MS based on the pattern and that multiple lesions enhanced.   She did feel clumsy and fall once in June or July.    She received 5 days of IV Solumedrol in late [place]y, early June.     She was prescribed Ocrevus and had the split dose 08/13/2017  and 08/28/2017.      She feels gait is doing well and is back to baseline.   Strength is back to baseline but she notes she fatigue more quickly climbing stairs.    She denies numbness.   Bladder function is better but she still notes urgency.   No recent incontinence.   Vision is fine now.    Diplopia resolved.    She has had a lot of fatigue the past few months.   She doesn't sleep well some nights when her husband is out of town (travels every week).    Mood is doing ok.  She denies depression but has been sad a few time.   She feels cognition is worse this year with reduced focus/attention and spme verbal fluency issues.    She is otherwise fairly healthy with benign essential hypertension treated with losartan.   She has no FH of MS.    I personally reviewed the MRIs of the brain and spine performed 07/09/2017 and  07/28/2017.  The MRI of the brain shows multiple T2/FLAIR hyperintense foci in the hemispheres as well in the posterior pons.  About 7-8 of the lesions were enhancing after contrast.  The largest focus is in the left parietal lobe.    MRI of the cervical and thoracic spine also showed foci.  Located at C2, T1, T5 and T11.  None of them and enhanced.   She also had lab work in May and June 2019.  The JCV antibody was positive at 1.56.  The neuromyelitis optica antibody was negative.  REVIEW OF SYSTEMS: Constitutional: No fevers, chills, sweats, or change in appetite.   She has some fatigue. Eyes: No visual changes, double vision, eye pain Ear, nose and throat: No hearing loss, ear pain, nasal congestion, sore throat Cardiovascular: No chest pain, palpitations Respiratory: No shortness of breath at rest or with exertion.   No wheezes GastrointestinaI: No nausea, vomiting, diarrhea, abdominal pain, fecal incontinence Genitourinary:She has urinary urgency and frequency.  No incontinence.   Musculoskeletal: No neck pain, back pain Integumentary: No rash, pruritus, skin lesions Neurological: as above Psychiatric: No depression at this time but has felt a little sad since her diagnosis..  No anxiety Endocrine: No palpitations, diaphoresis, change in appetite, change in weigh or increased thirst Hematologic/Lymphatic: No anemia, purpura, petechiae. Allergic/Immunologic: No itchy/runny eyes, nasal congestion, recent allergic reactions, rashes  ALLERGIES: Allergies  Allergen Reactions  . Adderall [Amphetamine-Dextroamphetamine]     Pt feels it caused anxiety attack    HOME MEDICATIONS:  Current Outpatient Medications:  .  eletriptan (RELPAX) 40 MG tablet, Take 40 mg by mouth as needed for migraine or headache. May repeat in 2 hours if headache persists or recurs., Disp: , Rfl:  .  ocrelizumab 600 mg in sodium chloride 0.9 % 500 mL, Inject 600 mg into the vein once., Disp: , Rfl:  .   indomethacin (INDOCIN) 25 MG capsule, Take 1 capsule (25 mg total) by mouth 2 (two) times daily with a meal., Disp: 30 capsule, Rfl: 1  PAST MEDICAL HISTORY: Past Medical History:  Diagnosis Date  . Headache   . Multiple sclerosis ([place])   . Vision abnormalities     PAST SURGICAL HISTORY: Past Surgical History:  Procedure Laterality Date  . CERVICAL CONE BIOPSY      FAMILY HISTORY: Family History  Problem Relation Age of Onset  . Hypertension Mother   . Diabetes type II Mother   . Multiple myeloma Mother   .  Hypertension Father   . Healthy Sister   . Healthy Brother   . Diabetes Maternal Grandmother   . Diabetes Maternal Grandfather   . Hypertension Paternal Grandmother   . Dementia Paternal Grandmother     SOCIAL HISTORY:  Social History   Socioeconomic History  . Marital status: Married    Spouse name: Not on file  . Number of children: Not on file  . Years of education: Not on file  . Highest education level: Not on file  Occupational History  . Not on file  Social Needs  . Financial resource strain: Not on file  . Food insecurity:    Worry: Not on file    Inability: Not on file  . Transportation needs:    Medical: Not on file    Non-medical: Not on file  Tobacco Use  . Smoking status: Former Research scientist (life sciences)  . Smokeless tobacco: Never Used  Substance and Sexual Activity  . Alcohol use: Not Currently  . Drug use: Never  . Sexual activity: Not on file  Lifestyle  . Physical activity:    Days per week: Not on file    Minutes per session: Not on file  . Stress: Not on file  Relationships  . Social connections:    Talks on phone: Not on file    Gets together: Not on file    Attends religious service: Not on file    Active member of club or organization: Not on file    Attends meetings of clubs or organizations: Not on file    Relationship status: Not on file  . Intimate partner violence:    Fear of current or ex partner: Not on file    Emotionally abused:  Not on file    Physically abused: Not on file    Forced sexual activity: Not on file  Other Topics Concern  . Not on file  Social History Narrative  . Not on file     PHYSICAL EXAM  Vitals:   02/05/18 0835  BP: 110/80  Pulse: 72  SpO2: 98%  Weight: 153 lb 8 oz (69.6 kg)  Height: '5\' 2"'  (1.575 m)    Body mass index is 28.08 kg/m.   General: The patient is well-developed and well-nourished and in no acute distress  Eyes:  Funduscopic exam shows normal optic discs and retinal vessels.  Neck: The neck is supple, no carotid bruits are noted.  The neck is nontender.  Cardiovascular: The heart has a regular rate and rhythm with a normal S1 and S2. There were no murmurs, gallops or rubs. Lungs are clear to auscultation.  Skin: Extremities are without significant edema.  Musculoskeletal:  Back is nontender  Neurologic Exam  Mental status: The patient is alert and oriented x 3 at the time of the examination. The patient has apparent normal recent and remote memory, with an apparently normal attention span and concentration ability.   Speech is normal.  Cranial nerves: Extraocular movements are full. Pupils are equal, round, and reactive to light and accomodation.  Visual fields are full.  Facial symmetry is present. There is good facial sensation to soft touch bilaterally.Facial strength is normal.  Trapezius and sternocleidomastoid strength is normal. No dysarthria is noted.  The tongue is midline, and the patient has symmetric elevation of the soft palate. No obvious hearing deficits are noted.  Motor:  Muscle bulk is normal.   Tone is normal. Strength is  5 / 5 in all 4 extremities.   Sensory:  Sensory testing is intact to pinprick, soft touch and vibration sensation in all 4 extremities.  Coordination: Cerebellar testing reveals good finger-nose-finger and heel-to-shin bilaterally.  Gait and station: Station is normal.   Gait is normal.  The tandem gait is mildly wide.   Romberg is negative.   Reflexes: Deep tendon reflexes are symmetric and normal bilaterally.     DIAGNOSTIC DATA (LABS, IMAGING, TESTING) - I reviewed patient records, labs, notes, testing and imaging myself where available.  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    Continue Ocrevus.   Check IgG/IgM and CBC 2.   For her headaches I will send in indomethacin to take as needed up to twice a day.  However, if headaches have not resolved in 2 weeks she should give [place] a call back and I will consider zonisamide on imipramine to help with prophylaxis. 3.    Stay active and exercise as tolerated. 4.    She will return in 6 months for regular visit.  We will need to check an MRI of the brain at that time to determine if there is any subclinical progression   Donnis Pecha A. Felecia Shelling, MD, PhD, FAAN Certified in Neurology, Clinical Neurophysiology, Sleep Medicine, Pain Medicine and Neuroimaging Director, [place] [facility] [address], [place], [place] [address] [phone]

## 2018-02-06 LAB — CBC WITH DIFFERENTIAL/PLATELET
BASOS ABS: 0.1 10*3/uL (ref 0.0–0.2)
Basos: 1 %
EOS (ABSOLUTE): 0.2 10*3/uL (ref 0.0–0.4)
Eos: 5 %
Hematocrit: 39.1 % (ref 34.0–46.6)
Hemoglobin: 12.9 g/dL (ref 11.1–15.9)
Immature Grans (Abs): 0 10*3/uL (ref 0.0–0.1)
Immature Granulocytes: 0 %
LYMPHS ABS: 1.3 10*3/uL (ref 0.7–3.1)
Lymphs: 28 %
MCH: 27.4 pg (ref 26.6–33.0)
MCHC: 33 g/dL (ref 31.5–35.7)
MCV: 83 fL (ref 79–97)
MONOS ABS: 0.3 10*3/uL (ref 0.1–0.9)
Monocytes: 7 %
Neutrophils Absolute: 2.8 10*3/uL (ref 1.4–7.0)
Neutrophils: 59 %
PLATELETS: 314 10*3/uL (ref 150–450)
RBC: 4.71 x10E6/uL (ref 3.77–5.28)
RDW: 12.3 % (ref 12.3–15.4)
WBC: 4.7 10*3/uL (ref 3.4–10.8)

## 2018-02-06 LAB — IGG, IGA, IGM
IGA/IMMUNOGLOBULIN A, SERUM: 121 mg/dL (ref 87–352)
IGG (IMMUNOGLOBIN G), SERUM: 1341 mg/dL (ref 700–1600)
IgM (Immunoglobulin M), Srm: 47 mg/dL (ref 26–217)

## 2018-02-08 ENCOUNTER — Telehealth: Payer: Self-pay | Admitting: Neurology

## 2018-02-08 ENCOUNTER — Telehealth: Payer: Self-pay | Admitting: *Deleted

## 2018-02-08 NOTE — Telephone Encounter (Signed)
lvm for pt to call back about scheduling mri. Patient needs to be scheduled in June before her follow up appointment with Dr. Epimenio Foot

## 2018-02-08 NOTE — Telephone Encounter (Signed)
-----   Message from Asa Lenteichard A Sater, MD sent at 02/08/2018  3:29 PM EST ----- Please let the patient know that the lab work is fine.

## 2018-02-08 NOTE — Telephone Encounter (Signed)
Called and spoke with pt. Relayed lab work is fine per Dr. Epimenio Foot. She verbalized understanding.

## 2018-04-13 ENCOUNTER — Other Ambulatory Visit: Payer: Self-pay

## 2018-04-13 ENCOUNTER — Emergency Department (HOSPITAL_COMMUNITY): Payer: BC Managed Care – PPO

## 2018-04-13 ENCOUNTER — Emergency Department (HOSPITAL_COMMUNITY)
Admission: EM | Admit: 2018-04-13 | Discharge: 2018-04-14 | Disposition: A | Discharge status: Home / Self Care | Payer: BC Managed Care – PPO | Attending: Emergency Medicine | Admitting: Emergency Medicine

## 2018-04-13 ENCOUNTER — Encounter (HOSPITAL_COMMUNITY): Payer: Self-pay | Admitting: Emergency Medicine

## 2018-04-13 DIAGNOSIS — I10 Essential (primary) hypertension: Secondary | ICD-10-CM | POA: Insufficient documentation

## 2018-04-13 DIAGNOSIS — R072 Precordial pain: Secondary | ICD-10-CM | POA: Insufficient documentation

## 2018-04-13 DIAGNOSIS — R002 Palpitations: Secondary | ICD-10-CM | POA: Diagnosis not present

## 2018-04-13 DIAGNOSIS — Z87891 Personal history of nicotine dependence: Secondary | ICD-10-CM | POA: Insufficient documentation

## 2018-04-13 DIAGNOSIS — R079 Chest pain, unspecified: Secondary | ICD-10-CM | POA: Diagnosis present

## 2018-04-13 HISTORY — DX: Essential (primary) hypertension: I10

## 2018-04-13 LAB — BASIC METABOLIC PANEL
Anion gap: 7 (ref 5–15)
BUN: 7 mg/dL (ref 6–20)
CHLORIDE: 107 mmol/L (ref 98–111)
CO2: 25 mmol/L (ref 22–32)
CREATININE: 0.8 mg/dL (ref 0.44–1.00)
Calcium: 8.9 mg/dL (ref 8.9–10.3)
GFR calc Af Amer: >60 mL/min (ref >60)
GFR calc non Af Amer: >60 mL/min (ref >60)
GLUCOSE: 90 mg/dL (ref 70–99)
POTASSIUM: 3.5 mmol/L (ref 3.5–5.1)
SODIUM: 139 mmol/L (ref 135–145)

## 2018-04-13 LAB — CBC
HEMATOCRIT: 41.3 % (ref 36.0–46.0)
HEMOGLOBIN: 13 g/dL (ref 12.0–15.0)
MCH: 26.6 pg (ref 26.0–34.0)
MCHC: 31.5 g/dL (ref 30.0–36.0)
MCV: 84.5 fL (ref 80.0–100.0)
Platelets: 269 10*3/uL (ref 150–400)
RBC: 4.89 MIL/uL (ref 3.87–5.11)
RDW: 12.4 % (ref 11.5–15.5)
WBC: 6.1 10*3/uL (ref 4.0–10.5)
nRBC: 0 % (ref 0.0–0.2)

## 2018-04-13 LAB — I-STAT BETA HCG BLOOD, ED (MC, WL, AP ONLY): I-stat hCG, quantitative: <5 m[IU]/mL (ref <5)

## 2018-04-13 LAB — I-STAT TROPONIN, ED: Troponin i, poc: 0 ng/mL (ref 0.00–0.08)

## 2018-04-13 MED ORDER — SODIUM CHLORIDE 0.9% FLUSH: 3.0000 mL | Freq: Once | INTRAVENOUS | Status: DC

## 2018-04-13 NOTE — ED Triage Notes (Signed)
Pt reports substernal chest discomfort, shortness of breath and burning sensation intermittently since Sunday. Denis n/v/d. NAD.

## 2018-04-14 NOTE — Discharge Instructions (Addendum)

## 2018-04-14 NOTE — ED Provider Notes (Signed)
[facility] EMERGENCY DEPARTMENT Provider Note   CSN: [id number] Arrival date & time: 04/13/18  2028    History   Chief Complaint Chief Complaint  Patient presents with  . Chest Pain    HPI Marissa Andersen is a 33 y.o. female.     The history is provided by the patient.  Chest Pain  Pain location:  Substernal area Pain quality: pressure   Pain radiates to:  Does not radiate Pain severity:  Moderate Onset quality:  Gradual Timing:  Intermittent Progression:  Improving Chronicity:  New Relieved by:  None tried Worsened by:  Deep breathing Associated symptoms: palpitations and shortness of breath   Associated symptoms: no cough, no fever, no syncope and no vomiting   Associated symptoms comment:  Denies hemoptysis  Risk factors: hypertension   Risk factors: no birth control, no coronary artery disease, no immobilization, not pregnant, no prior DVT/PE and no smoking   Patient reports she has had substernal chest pain intermittently for the past 2 days.  It comes and goes at random, but at times is slightly worse with breathing. No Fever/vomiting.  She reports shortness of breath.  No cough or hemoptysis. She also reports feeling that her heart is racing.  No syncope. She had similar episode last fall. She has history of MS with ocrevus infusions, no recent issues She reports h/o HTN and recent increase in meds  fam hx - + for CAD Soc hx - nonsmoker, no drug abuse Past Medical History:  Diagnosis Date  . Headache   . Hypertension   . Multiple sclerosis ([place])   . Vision abnormalities     Patient Active Problem List   Diagnosis Date Noted  . High risk medication use 02/05/2018  . Anxiety 02/05/2018  . 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 Gait disturbance 10/01/2017  . Multiple sclerosis ([place]) 07/15/2017    Past Surgical History:  Procedure Laterality Date  . CERVICAL CONE BIOPSY       OB History   No  obstetric history on file.      Home Medications    Prior to Admission medications   Medication Sig Start Date End Date Taking? Authorizing Provider  losartan (COZAAR) 25 MG tablet Take 50 mg by mouth daily. 04/12/18  Yes [provider]  indomethacin (INDOCIN) 25 MG capsule Take 1 capsule (25 mg total) by mouth 2 (two) times daily with a meal. Patient not taking: Reported on 04/14/2018 02/05/18   Britt Bottom, MD    Family History Family History  Problem Relation Age of Onset  . Hypertension Mother   . Diabetes type II Mother   . Multiple myeloma Mother   . Hypertension Father   . Healthy Sister   . Healthy Brother   . Diabetes Maternal Grandmother   . Diabetes Maternal Grandfather   . Hypertension Paternal Grandmother   . Dementia Paternal Grandmother     Social History Social History   Tobacco Use  . Smoking status: Former Research scientist (life sciences)  . Smokeless tobacco: Never Used  Substance Use Topics  . Alcohol use: Not Currently  . Drug use: Never     Allergies   Adderall [amphetamine-dextroamphetamine]   Review of Systems Review of Systems  Constitutional: Negative for fever.  Respiratory: Positive for shortness of breath. Negative for cough.   Cardiovascular: Positive for chest pain and palpitations. Negative for syncope.  Gastrointestinal: Negative for vomiting.  Neurological: Negative for syncope.  All other systems reviewed  and are negative.    Physical Exam Updated Vital Signs BP (!) 138/94 (BP Location: Right Arm)   Pulse 82   Temp 98.2 F (36.8 C) (Oral)   Resp 16   LMP 03/24/2018   SpO2 98%   Physical Exam CONSTITUTIONAL: Well developed/well nourished HEAD: Normocephalic/atraumatic EYES: EOMI/PERRL ENMT: Mucous membranes moist NECK: supple no meningeal signs SPINE/BACK:entire spine nontender CV: S1/S2 noted, no murmurs/rubs/gallops noted LUNGS: Lungs are clear to auscultation bilaterally, no apparent distress ABDOMEN: soft, nontender,  no rebound or guarding, bowel sounds noted throughout abdomen GU:no cva tenderness NEURO: Pt is awake/alert/appropriate, moves all extremitiesx4.  No facial droop.   EXTREMITIES: pulses normal/equal, full ROM, no lower extremity edema or tenderness SKIN: warm, color normal PSYCH: no abnormalities of mood noted, alert and oriented to situation   ED Treatments / Results  Labs (all labs ordered are listed, but only abnormal results are displayed) Labs Reviewed  BASIC METABOLIC PANEL  CBC  I-STAT TROPONIN, ED  I-STAT BETA HCG BLOOD, ED ([facility], [facility], [facility] ONLY)    EKG EKG Interpretation  Date/Time:  Tuesday April 13 2018 20:38:51 EST Ventricular Rate:  82 PR Interval:  174 QRS Duration: 82 QT Interval:  344 QTC Calculation: 401 R Axis:   68 Text Interpretation:  Normal sinus rhythm with sinus arrhythmia Normal ECG No significant change since last tracing Confirmed by Ripley Fraise [phone]) on 04/14/2018 12:29:39 AM   Radiology Dg Chest 2 View  Result Date: 04/13/2018 CLINICAL DATA:  Chest discomfort, shortness of breath EXAM: CHEST - 2 VIEW COMPARISON:  11/06/2017 FINDINGS: Heart and mediastinal contours are within normal limits. No focal opacities or effusions. No acute bony abnormality. IMPRESSION: No active cardiopulmonary disease. Electronically Signed   By: Rolm Baptise M.D.   On: 04/13/2018 21:16    Procedures Procedures   Medications Ordered in ED Medications  sodium chloride flush (NS) 0.9 % injection 3 mL (3 mLs Intravenous Not Given 04/14/18 0020)     Initial Impression / Assessment and Plan / ED Course  I have reviewed the triage vital signs and the nursing notes.  Pertinent labs & imaging results that were available during my care of the patient were reviewed by me and considered in my medical decision making (see chart for details).        Patient well-appearing. Most of her symptoms have resolved, EKG is unchanged, troponin is negative.  She appears PERC  negative. Suspicion for ACS/PE/dissection is low  However, I do feel she should get follow-up for palpitations. She has had episodes recently as well as in the past.  Advise follow-up with her PCP as she may require outpatient monitoring. We discussed strict ER return precautions  Final Clinical Impressions(s) / ED Diagnoses   Final diagnoses:  Precordial pain  Palpitations    ED Discharge Orders    None       Ripley Fraise, MD 04/14/18 [phone]

## 2018-04-22 ENCOUNTER — Telehealth: Payer: Self-pay | Admitting: Neurology

## 2018-04-22 MED ORDER — DICLOFENAC SODIUM 75 MG PO TBEC: DELAYED_RELEASE_TABLET | ORAL | 0 refills | Status: DC

## 2018-04-22 NOTE — Telephone Encounter (Signed)
Pt is having HA's again. She took indomethacin (INDOCIN) 25 MG capsule in December but it made her nauseated. Is there another medication she could try?

## 2018-04-22 NOTE — Telephone Encounter (Signed)
I spoke with Dr. Epimenio Foot verbally in regards to this message. He recommendation was to send Diclofenac 75 mg # 30 0 refill 1 pill bid as needed for headaches. I contacted the pt and she was agreeable to this med/dosage. I have submitted electronically to [facility] on [address]. Pt advised to call back if she had any trouble picking this medication up or if it did not help alleviate her headaches. Pt verbalized understanding/appreciation.

## 2018-04-27 ENCOUNTER — Telehealth: Payer: Self-pay | Admitting: Neurology

## 2018-04-27 NOTE — Telephone Encounter (Signed)
Pt called wondering with this Corona Virus and having MS is she considered high risk and what are the rules with traveling. Please advise.

## 2018-04-28 NOTE — Telephone Encounter (Signed)
I called and spoke with pt. Advised her to take general precautions: washing hands frequently, avoiding large crowds, avoiding touching hands/face. If she becomes symptomatic, she should proceed to PCP to be evaluated asap. Up to her discretion if she travels to [facility] still. She verbalized understanding.

## 2018-05-24 ENCOUNTER — Telehealth: Payer: Self-pay | Admitting: *Deleted

## 2018-05-24 NOTE — Telephone Encounter (Signed)
Late entry: faxed completed/signed [employer] pres[employer]riber [employer] form re: Ocrevus to [phone] on 05/20/18. Received fax confirmation.

## 2018-06-07 NOTE — Telephone Encounter (Signed)
Called, LVM for pt to call office. 

## 2018-06-07 NOTE — Telephone Encounter (Signed)
I called pt back. Relayed Dr. Bonnita Hollow recommendation. She states PCP just prescribed that for her and she has yet to pick up from pharmacy. She will pick up and start this. Reminded her it takes 2-3 weeks for it to build up in her system for her to notice max benefit. She needs to give it time to work. Advised her to call back if she has further questions/concerns.

## 2018-06-07 NOTE — Telephone Encounter (Signed)
Pt called in and stated that she has been short of breath and having tightness in her chest she wants to know if this is a side effect from [employer]

## 2018-06-07 NOTE — Telephone Encounter (Signed)
Dr. Sater- what would you recommend? 

## 2018-06-07 NOTE — Telephone Encounter (Signed)
Called pt back. Chest tightness, SOB started in the last couple weeks. Sx vary by the day. Notices sx more when she is laying down or sitting down. Went to ED a couple times back in January. Advised she had panic attack. Xray completed/normal. Denies cough or any covid-19 sx. Denies any other signs of infection. Denies other sx.  She is wondering if this is MS hug. Last Ocrevus 02/11/2018. Advised I will discuss with Dr. Epimenio Foot and call back.

## 2018-06-07 NOTE — Telephone Encounter (Signed)
This is unlikely to be related to Ocrevus.  Some medications can help if due to panic attacks/anxiety.  We can start Zoloft 50 mg daily (#30 #5) and see if that helps.

## 2018-06-07 NOTE — Telephone Encounter (Signed)
Pt called back. RN not available. Please call back as soon as possible.

## 2018-06-09 ENCOUNTER — Other Ambulatory Visit (HOSPITAL_COMMUNITY)
Admission: RE | Admit: 2018-06-09 | Discharge: 2018-06-09 | Disposition: A | Discharge status: Home / Self Care | Payer: BC Managed Care – PPO | Source: Ambulatory Visit | Attending: Obstetrics and Gynecology | Admitting: Obstetrics and Gynecology

## 2018-06-09 ENCOUNTER — Other Ambulatory Visit: Payer: Self-pay | Admitting: Obstetrics and Gynecology

## 2018-06-09 DIAGNOSIS — N879 Dysplasia of cervix uteri, unspecified: Secondary | ICD-10-CM | POA: Insufficient documentation

## 2018-06-17 ENCOUNTER — Telehealth: Payer: Self-pay | Admitting: *Deleted

## 2018-06-17 NOTE — Telephone Encounter (Signed)
Received fax notification from [employer] that she was approved to receive Ocrevus free of charge. Gave to [facility] for their records.

## 2018-06-22 LAB — CYTOLOGY - PAP: Diagnosis: NEGATIVE

## 2018-07-29 ENCOUNTER — Telehealth: Payer: Self-pay | Admitting: Neurology

## 2018-07-29 NOTE — Telephone Encounter (Signed)
Pt has called asking when is she supposed to hear about the scheduling of her MRI

## 2018-07-29 NOTE — Telephone Encounter (Signed)
Marissa Andersen- can you follow up with her? Looks like you LVM in December to schedule the MRI

## 2018-07-29 NOTE — Telephone Encounter (Signed)
[employer] Auth: [id number] (exp. 07/29/18 to 01/24/19)   I spoke to the patient to schedule her MRI at [facility]. She was going to give me a call back.

## 2018-08-13 ENCOUNTER — Ambulatory Visit: Payer: BC Managed Care – PPO | Admitting: Neurology

## 2018-08-30 NOTE — Telephone Encounter (Signed)
Patient called today her MRI is scheduled for 09/01/18 at [facility].

## 2018-09-01 ENCOUNTER — Ambulatory Visit: Payer: BC Managed Care – PPO

## 2018-09-01 ENCOUNTER — Other Ambulatory Visit: Payer: Self-pay

## 2018-09-01 DIAGNOSIS — G35 Multiple sclerosis: Secondary | ICD-10-CM

## 2018-09-01 MED ORDER — GADOBENATE DIMEGLUMINE 529 MG/ML IV SOLN
14.0000 mL | Freq: Once | INTRAVENOUS | Status: AC | PRN
  Administered 2018-09-01: 14 mL via INTRAVENOUS

## 2018-09-02 ENCOUNTER — Telehealth: Payer: Self-pay | Admitting: *Deleted

## 2018-09-02 NOTE — Telephone Encounter (Signed)
-----   Message from Britt Bottom, MD sent at 09/01/2018  6:29 PM EDT ----- Please let the patient know that the MRI looks good   No new MS lesions

## 2018-09-02 NOTE — Telephone Encounter (Signed)
Called and spoke with pt about MRI results per Dr. Sater note. Pt verbalized understanding. °

## 2018-09-07 DIAGNOSIS — G35 Multiple sclerosis: Secondary | ICD-10-CM | POA: Diagnosis not present

## 2018-09-09 ENCOUNTER — Telehealth: Payer: Self-pay | Admitting: *Deleted

## 2018-09-09 NOTE — Telephone Encounter (Signed)
Pt called and spoke with phone staff. She just had her Ocrevus infusion this week on Tuesday, 09/07/18. Wanting to know if she can go to the dentist today at 10:45am. Advised phone staff to let pt know Dr. Felecia Shelling with a pt. I will ask him and call back to let her know.  Spoke w/ Dr. Felecia Shelling who approved pt to go to dental appt. I called pt back and informed her of this. She verbalized understanding.

## 2018-09-16 ENCOUNTER — Ambulatory Visit: Payer: BC Managed Care – PPO | Admitting: Neurology

## 2018-09-21 ENCOUNTER — Other Ambulatory Visit: Payer: Self-pay

## 2018-09-21 ENCOUNTER — Ambulatory Visit: Payer: BC Managed Care – PPO | Admitting: Neurology

## 2018-09-21 ENCOUNTER — Encounter: Payer: Self-pay | Admitting: Neurology

## 2018-09-21 VITALS — BP 99/67 | HR 78 | Temp 98.6°F | Ht 62.0 in | Wt 149.5 lb

## 2018-09-21 DIAGNOSIS — Z79899 Other long term (current) drug therapy: Secondary | ICD-10-CM

## 2018-09-21 DIAGNOSIS — G35 Multiple sclerosis: Secondary | ICD-10-CM

## 2018-09-21 DIAGNOSIS — R269 Unspecified abnormalities of gait and mobility: Secondary | ICD-10-CM | POA: Diagnosis not present

## 2018-09-21 NOTE — Progress Notes (Addendum)
[facility]  PATIENT: Marissa Andersen DOB: 11-Oct-1985  REFERRING DOCTOR OR PCP:  Dr. Barrie Folk SOURCE: Patient, notes from Dr. Nestor Ramp, imaging and lab reports, MRI images personally reviewed on PACS.  _________________________________   HISTORICAL  CHIEF COMPLAINT:  Chief Complaint  Patient presents with   Follow-up    RM 12. Last seen 02/05/18.    Multiple Sclerosis    On Ocrevus. Last infusion 09/07/2018   Headache    Was on indomethacin but got nauseous 01/2018. Rx switched to diclofenac prn 04/22/18 but pt still taking indomethacin    HISTORY OF PRESENT ILLNESS:  Marissa Andersen is a 33 y.o. woman with relapsing remitting multiple sclerosis.      Update 09/21/2018: She is on Ocrevus as a disease modifying therapy for her relapsing remitting MS.  The last infusion was 09/07/2018.  She tolerates it well.   She feels mostly stablebut notes some mild sensory changes in her left leg.  She has had no major issues.   MRI 08/22/2018 showed no new lesions.  She is walking well and easily goes up and down stairs.    She has some numbness in her thighs.    Limbs are strong.   She notes some pain in her joints/limbs.   She has a little tightness in her muscles.    Bladder function is doing well.    Vision is fine.    She notes fatigue but also has had poor sleep many nights getting 6 hours on average.   She has trouble staying asleep.  She denies depression but has anxiety and was placed on sertraline.   Cognition is doing ok--some word finding errors  She has been experiencing headaches only a couple times a month.  Indomethacin worked well caused GI issues so she was switched to diclofenac.  However, she still takes the indomethacin rather than doclofenac as it is more effective.   If not effective she takes eletriptan if not effective.      Update 02/05/2018: Her first Ocrevus infusions were in late June and early July 2019.  She tolerated the infusions well.  She is  scheduled to have her next infusion in about a week.  She denies any exacerbations.    Currently, her gait is doing well.  Strength returned to baseline from her initial exacerbation.  She fatigues easily.   She feels the legs are slightly numb.  She has some bladder urgency but no incontinence.  She does not note any difficulties with her vision.  She had some diplopia that resolved.  I had prescribed Adderall for fatigue and cognitive followed.  She was unable to tolerate it due to feeling more anxious and having a panic attack a few days after starting   She notes myalgias ans knee and back aches.   She notes anxiety in general more than depression.   She haas had only the one panic attack.   She reports migraine headaches the last 2 weeks but notes more stress with husband out of town most of that time.   HA is bilateral.   Pain is pressure like,  She has mild nausea but no vomiting.   No photophobia.     Moving does not change the intensity.    She was prescribed eletriptan and took 2 the one day with benefit and one yesterday with benefit.      From 10/01/2017: She is a 33 year old woman who was diagnosed with MS in May 2019.  She woke  up with a severe headache in January that did not improve.    Then she had dizziness and nausea in January and February 2019.    Her smile seemed distorted and she had riht facial numbness in February 2019.    Around March, she noted difficulty with vision out of the right eye followed by diplopia. In [place] PCP first started to treat her for migraine.   When symptoms persisted, she had a head CT which was read as normal (in retrospect, there is one focus on the left).  At some point, she noted increased urinary frequency,  She saw Dr. Nestor Ramp May 2019.   An MRI was ordered in late May 2019 and she was diagnosed with MS based on the pattern and that multiple lesions enhanced.   She did feel clumsy and fall once in June or July.    She received 5 days of IV  Solumedrol in late MAy, early June.     She was prescribed Ocrevus and had the split dose 08/13/2017 and 08/28/2017.      She feels gait is doing well and is back to baseline.   Strength is back to baseline but she notes she fatigue more quickly climbing stairs.    She denies numbness.   Bladder function is better but she still notes urgency.   No recent incontinence.   Vision is fine now.    Diplopia resolved.    She has had a lot of fatigue the past few months.   She doesn't sleep well some nights when her husband is out of town (travels every week).    Mood is doing ok.  She denies depression but has been sad a few time.   She feels cognition is worse this year with reduced focus/attention and spme verbal fluency issues.    She is otherwise fairly healthy with benign essential hypertension treated with losartan.   She has no FH of MS.    I personally reviewed the MRIs of the brain and spine performed 07/09/2017 and 07/28/2017.  The MRI of the brain shows multiple T2/FLAIR hyperintense foci in the hemispheres as well in the posterior pons.  About 7-8 of the lesions were enhancing after contrast.  The largest focus is in the left parietal lobe.    MRI of the cervical and thoracic spine also showed foci.  Located at C2, T1, T5 and T11.  None of them and enhanced.   She also had lab work in May and June 2019.  The JCV antibody was positive at 1.56.  The neuromyelitis optica antibody was negative.  REVIEW OF SYSTEMS: Constitutional: No fevers, chills, sweats, or change in appetite.   She has some fatigue. Eyes: No visual changes, double vision, eye pain Ear, nose and throat: No hearing loss, ear pain, nasal congestion, sore throat Cardiovascular: No chest pain, palpitations Respiratory: No shortness of breath at rest or with exertion.   No wheezes GastrointestinaI: No nausea, vomiting, diarrhea, abdominal pain, fecal incontinence Genitourinary:She has urinary urgency and frequency.  No incontinence.     Musculoskeletal: No neck pain, back pain Integumentary: No rash, pruritus, skin lesions Neurological: as above Psychiatric: No depression at this time but has felt a little sad since her diagnosis..  No anxiety Endocrine: No palpitations, diaphoresis, change in appetite, change in weigh or increased thirst Hematologic/Lymphatic: No anemia, purpura, petechiae. Allergic/Immunologic: No itchy/runny eyes, nasal congestion, recent allergic reactions, rashes  ALLERGIES: Allergies  Allergen Reactions   Adderall [Amphetamine-Dextroamphetamine]  Pt feels it caused anxiety attack    HOME MEDICATIONS:  Current Outpatient Medications:    indomethacin (INDOCIN) 25 MG capsule, Take 25 mg by mouth 2 (two) times daily with a meal., Disp: , Rfl:    losartan (COZAAR) 25 MG tablet, Take 50 mg by mouth daily., Disp: , Rfl:    ocrelizumab (OCREVUS) 300 MG/10ML injection, Inject into the vein once., Disp: , Rfl:    diclofenac (VOLTAREN) 75 MG EC tablet, Take 1 tablet two times daily as needed for headaches. (Patient not taking: Reported on 09/21/2018), Disp: 30 tablet, Rfl: 0  PAST MEDICAL HISTORY: Past Medical History:  Diagnosis Date   Headache    Hypertension    Multiple sclerosis ([place])    Vision abnormalities     PAST SURGICAL HISTORY: Past Surgical History:  Procedure Laterality Date   CERVICAL CONE BIOPSY      FAMILY HISTORY: Family History  Problem Relation Age of Onset   Hypertension Mother    Diabetes type II Mother    Multiple myeloma Mother    Hypertension Father    Healthy Sister    Healthy Brother    Diabetes Maternal Grandmother    Diabetes Maternal Grandfather    Hypertension Paternal Grandmother    Dementia Paternal Grandmother     SOCIAL HISTORY:  Social History   Socioeconomic History   Marital status: Married    Spouse name: Not on file   Number of children: Not on file   Years of education: Not on file   Highest education level:  Not on file  Occupational History   Not on file  Social Needs   Financial resource strain: Not on file   Food insecurity    Worry: Not on file    Inability: Not on file   Transportation needs    Medical: Not on file    Non-medical: Not on file  Tobacco Use   Smoking status: Former Smoker   Smokeless tobacco: Never Used  Substance and Sexual Activity   Alcohol use: Not Currently   Drug use: Never   Sexual activity: Not on file  Lifestyle   Physical activity    Days per week: Not on file    Minutes per session: Not on file   Stress: Not on file  Relationships   Social connections    Talks on phone: Not on file    Gets together: Not on file    Attends religious service: Not on file    Active member of club or organization: Not on file    Attends meetings of clubs or organizations: Not on file    Relationship status: Not on file   Intimate partner violence    Fear of current or ex partner: Not on file    Emotionally abused: Not on file    Physically abused: Not on file    Forced sexual activity: Not on file  Other Topics Concern   Not on file  Social History Narrative   Not on file     PHYSICAL EXAM  Vitals:   09/21/18 1251  BP: 99/67  Pulse: 78  Temp: 98.6 F (37 C)  Weight: 149 lb 8 oz (67.8 kg)  Height: '5\' 2"'  (1.575 m)    Body mass index is 27.34 kg/m.   General: The patient is well-developed and well-nourished and in no acute distress  Skin: Extremities are without rash or edema.  Musculoskeletal:  Back is nontender  Neurologic Exam  Mental status: The patient  is alert and oriented x 3 at the time of the examination. The patient has apparent normal recent and remote memory, with an apparently normal attention span and concentration ability.   Speech is normal.  Cranial nerves: Extraocular movements are full. Color vision is symmetric.  Facial symmetry is present. There is good facial sensation to soft touch bilaterally.Facial  strength is normal.  Trapezius and sternocleidomastoid strength is normal. No dysarthria is noted.  The tongue is midline, and the patient has symmetric elevation of the soft palate. No obvious hearing deficits are noted.  Motor:  Muscle bulk is normal.   Tone is normal. Strength is  5 / 5 in all 4 extremities.   Sensory: Sensory testing is intact to pinprick, soft touch and vibration sensation in arms but reduced vibration and touch in left leg relative to the right  Coordination: Cerebellar testing reveals good finger-nose-finger and heel-to-shin bilaterally.  Gait and station: Station is normal.   Gait is normal.  The tandem gait is mildly wide.  Romberg is negative.   Reflexes: Deep tendon reflexes are symmetric and normal bilaterally.     DIAGNOSTIC DATA (LABS, IMAGING, TESTING) - I reviewed patient records, labs, notes, testing and imaging myself where available.  Lab Results  Component Value Date   WBC 6.1 04/13/2018   HGB 13.0 04/13/2018   HCT 41.3 04/13/2018   MCV 84.5 04/13/2018   PLT 269 04/13/2018      ASSESSMENT AND PLAN    1. Multiple sclerosis (HCC)   2. Gait disturbance   3. High risk medication use      1.    Continue Ocrevus.   Check labs today 2.   As long as headaches intermittent, can continue indomethacin and/or eletriptan.  If they worsen, consider zoniamide. 3.    Stay active and exercise as tolerated. 4.    She will return in 6 months for regular visit.      Masen Luallen A. Felecia Shelling, MD, PhD, FAAN Certified in Neurology, Clinical Neurophysiology, Sleep Medicine, Pain Medicine and Neuroimaging Director, [place] [facility] [address] [place], [place] [address] [phone]

## 2018-09-22 ENCOUNTER — Telehealth: Payer: Self-pay | Admitting: *Deleted

## 2018-09-22 LAB — CBC WITH DIFFERENTIAL/PLATELET
Basophils Absolute: 0 10*3/uL (ref 0.0–0.2)
Basos: 1 %
EOS (ABSOLUTE): 0.2 10*3/uL (ref 0.0–0.4)
Eos: 3 %
Hematocrit: 38 % (ref 34.0–46.6)
Hemoglobin: 12.6 g/dL (ref 11.1–15.9)
Immature Grans (Abs): 0 10*3/uL (ref 0.0–0.1)
Immature Granulocytes: 0 %
Lymphocytes Absolute: 1.5 10*3/uL (ref 0.7–3.1)
Lymphs: 24 %
MCH: 27.5 pg (ref 26.6–33.0)
MCHC: 33.2 g/dL (ref 31.5–35.7)
MCV: 83 fL (ref 79–97)
Monocytes Absolute: 0.4 10*3/uL (ref 0.1–0.9)
Monocytes: 7 %
Neutrophils Absolute: 4.2 10*3/uL (ref 1.4–7.0)
Neutrophils: 65 %
Platelets: 333 10*3/uL (ref 150–450)
RBC: 4.58 x10E6/uL (ref 3.77–5.28)
RDW: 12.9 % (ref 11.7–15.4)
WBC: 6.4 10*3/uL (ref 3.4–10.8)

## 2018-09-22 LAB — IGG, IGA, IGM
IgA/Immunoglobulin A, Serum: 117 mg/dL (ref 87–352)
IgG (Immunoglobin G), Serum: 1226 mg/dL (ref 586–1602)
IgM (Immunoglobulin M), Srm: 36 mg/dL (ref 26–217)

## 2018-09-22 NOTE — Telephone Encounter (Signed)
Called and spoke with pt about results per Dr. Sater note. She verbalized understanding.  

## 2018-09-22 NOTE — Telephone Encounter (Signed)
-----   Message from Britt Bottom, MD sent at 09/22/2018  2:09 PM EDT ----- Please let the patient know that the lab work is fine.

## 2018-10-01 ENCOUNTER — Other Ambulatory Visit: Payer: Self-pay

## 2018-10-01 DIAGNOSIS — Z20822 Contact with and (suspected) exposure to covid-19: Secondary | ICD-10-CM

## 2018-10-03 LAB — NOVEL CORONAVIRUS, NAA: SARS-CoV-2, NAA: NOT DETECTED

## 2018-12-17 DIAGNOSIS — E7800 Pure hypercholesterolemia, unspecified: Secondary | ICD-10-CM | POA: Diagnosis not present

## 2018-12-23 DIAGNOSIS — L700 Acne vulgaris: Secondary | ICD-10-CM | POA: Diagnosis not present

## 2018-12-23 DIAGNOSIS — L819 Disorder of pigmentation, unspecified: Secondary | ICD-10-CM | POA: Diagnosis not present

## 2019-01-21 ENCOUNTER — Other Ambulatory Visit (HOSPITAL_COMMUNITY)
Admission: RE | Admit: 2019-01-21 | Discharge: 2019-01-21 | Disposition: A | Discharge status: Home / Self Care | Payer: BC Managed Care – PPO | Source: Ambulatory Visit | Attending: Obstetrics and Gynecology | Admitting: Obstetrics and Gynecology

## 2019-01-21 ENCOUNTER — Other Ambulatory Visit: Payer: Self-pay | Admitting: Obstetrics and Gynecology

## 2019-01-21 DIAGNOSIS — Z01419 Encounter for gynecological examination (general) (routine) without abnormal findings: Secondary | ICD-10-CM | POA: Insufficient documentation

## 2019-01-26 LAB — CYTOLOGY - PAP
Comment: NEGATIVE
Diagnosis: NEGATIVE
High risk HPV: NEGATIVE

## 2019-03-03 ENCOUNTER — Telehealth: Payer: Self-pay

## 2019-03-03 NOTE — Telephone Encounter (Signed)
Fish oil should be between 500 and 1500 mg a day  Magnesium should be 400-500 mg a ay (200-250 mg tweice a day)

## 2019-03-03 NOTE — Telephone Encounter (Signed)
Called pt back to get a little more information. She is wanting to know how much OTC Vit C, Mg and fish oil she should take. She is already taking Vit D 5000U/day. Advised I will send Dr. Epimenio Foot message and call her back.

## 2019-03-03 NOTE — Telephone Encounter (Signed)
Dr. Epimenio Foot- she is wanting to know if you recommend specific doses for them?

## 2019-03-03 NOTE — Telephone Encounter (Signed)
Patient called to verify how much vitamins she should be taking. Please follow up.

## 2019-03-03 NOTE — Telephone Encounter (Signed)
Vit D 5000 U the most important.   Fish oil is likely also good.   Other vitamins are fine if she wants.

## 2019-03-03 NOTE — Telephone Encounter (Addendum)
Spoke with Dr. Epimenio Foot about Vit C, he recommends 75-100mg /day  I called and relayed Dr. Bonnita Hollow recommendations. She verbalized understanding. I added these to her med list.

## 2019-03-10 DIAGNOSIS — G35 Multiple sclerosis: Secondary | ICD-10-CM | POA: Diagnosis not present

## 2019-03-18 ENCOUNTER — Telehealth: Payer: Self-pay

## 2019-03-18 DIAGNOSIS — I10 Essential (primary) hypertension: Secondary | ICD-10-CM | POA: Diagnosis not present

## 2019-03-18 NOTE — Telephone Encounter (Signed)
I spoke to the patient this morning. Her BP has running high intermittently. She did confirm that she has an appt with her PCP today to address her hypertension. States she is expecting a medication change but says she needs to also make improvements in her diet and watch her stress level.

## 2019-03-18 NOTE — Telephone Encounter (Signed)
Patient called stating that her BP has been running high and would like to know if she would need a updated bp medication.   States the top number has been between 140-147 and the bottom has been between 97-102   Patient states she will be seen at her pcp office today. advised to go over concern with provider as well as office is closed today.

## 2019-03-24 ENCOUNTER — Other Ambulatory Visit: Payer: Self-pay

## 2019-03-24 ENCOUNTER — Ambulatory Visit: Payer: BC Managed Care – PPO | Admitting: Neurology

## 2019-03-24 ENCOUNTER — Encounter: Payer: Self-pay | Admitting: Neurology

## 2019-03-24 VITALS — BP 121/82 | HR 77 | Temp 97.7°F | Ht 62.0 in | Wt 153.0 lb

## 2019-03-24 DIAGNOSIS — Z79899 Other long term (current) drug therapy: Secondary | ICD-10-CM

## 2019-03-24 DIAGNOSIS — G35 Multiple sclerosis: Secondary | ICD-10-CM

## 2019-03-24 DIAGNOSIS — R5383 Other fatigue: Secondary | ICD-10-CM | POA: Insufficient documentation

## 2019-03-24 DIAGNOSIS — R269 Unspecified abnormalities of gait and mobility: Secondary | ICD-10-CM

## 2019-03-24 NOTE — Progress Notes (Signed)
[facility]  PATIENT: Marissa Andersen DOB: 11-06-1985  REFERRING DOCTOR OR PCP:  Dr. Barrie Folk SOURCE: Patient, notes from Dr. Nestor Ramp, imaging and lab reports, MRI images personally reviewed on PACS.  _________________________________   HISTORICAL  CHIEF COMPLAINT:  Chief Complaint  Patient presents with  . Multiple Sclerosis    rm 12, 6 month FU, husband- Delrae Alfred "extra tired, some numbness in legs"    HISTORY OF PRESENT ILLNESS:  Marissa Andersen is a 34 y.o. woman with relapsing remitting multiple sclerosis.       Update 03/24/2019: She is on Ocrevus, last infusion 2 weeks ago.   She denies any exacerbation.   Gait, balance, strength and sensation.   She can use the stairs without the bannister.  Her left leg weakness improved.   She gets occasional   Bladder function is fine.  Vision is stable.  She notes more fatigue.  She stays up until 1-2 am most nights.   She wakes up around 6 am some nights but then is back asleep until 8 am.    She naps some days.    Update 09/21/2018: She is on Ocrevus as a disease modifying therapy for her relapsing remitting MS.  The last infusion was 09/07/2018.  She tolerates it well.   She feels mostly stablebut notes some mild sensory changes in her left leg.  She has had no major issues.   MRI 08/22/2018 showed no new lesions.  She is walking well and easily goes up and down stairs.    She has some numbness in her thighs.    Limbs are strong.   She notes some pain in her joints/limbs.   She has a little tightness in her muscles.    Bladder function is doing well.    Vision is fine.    She notes fatigue but also has had poor sleep many nights getting 6 hours on average.   She has trouble staying asleep.  She denies depression but has anxiety and was placed on sertraline.   Cognition is doing ok--some word finding errors  She has been experiencing headaches only a couple times a month.  Indomethacin worked well caused GI issues so she was  switched to diclofenac.  However, she still takes the indomethacin rather than doclofenac as it is more effective.   If not effective she takes eletriptan if not effective.      Update 02/05/2018: Her first Ocrevus infusions were in late June and early July 2019.  She tolerated the infusions well.  She is scheduled to have her next infusion in about a week.  She denies any exacerbations.    Currently, her gait is doing well.  Strength returned to baseline from her initial exacerbation.  She fatigues easily.   She feels the legs are slightly numb.  She has some bladder urgency but no incontinence.  She does not note any difficulties with her vision.  She had some diplopia that resolved.  I had prescribed Adderall for fatigue and cognitive followed.  She was unable to tolerate it due to feeling more anxious and having a panic attack a few days after starting   She notes myalgias ans knee and back aches.   She notes anxiety in general more than depression.   She haas had only the one panic attack.   She reports migraine headaches the last 2 weeks but notes more stress with husband out of town most of that time.   HA is bilateral.  Pain is pressure like,  She has mild nausea but no vomiting.   No photophobia.     Moving does not change the intensity.    She was prescribed eletriptan and took 2 the one day with benefit and one yesterday with benefit.      From 10/01/2017: She is a 34 year old woman who was diagnosed with MS in May 2019.  She woke up with a severe headache in January that did not improve.    Then she had dizziness and nausea in January and February 2019.    Her smile seemed distorted and she had riht facial numbness in February 2019.    Around March, she noted difficulty with vision out of the right eye followed by diplopia. In [place] PCP first started to treat her for migraine.   When symptoms persisted, she had a head CT which was read as normal (in retrospect, there is one focus on  the left).  At some point, she noted increased urinary frequency,  She saw Dr. Nestor Ramp May 2019.   An MRI was ordered in late May 2019 and she was diagnosed with MS based on the pattern and that multiple lesions enhanced.   She did feel clumsy and fall once in June or July.    She received 5 days of IV Solumedrol in late MAy, early June.     She was prescribed Ocrevus and had the split dose 08/13/2017 and 08/28/2017.      She feels gait is doing well and is back to baseline.   Strength is back to baseline but she notes she fatigue more quickly climbing stairs.    She denies numbness.   Bladder function is better but she still notes urgency.   No recent incontinence.   Vision is fine now.    Diplopia resolved.    She has had a lot of fatigue the past few months.   She doesn't sleep well some nights when her husband is out of town (travels every week).    Mood is doing ok.  She denies depression but has been sad a few time.   She feels cognition is worse this year with reduced focus/attention and spme verbal fluency issues.    She is otherwise fairly healthy with benign essential hypertension treated with losartan.   She has no FH of MS.    I personally reviewed the MRIs of the brain and spine performed 07/09/2017 and 07/28/2017.  The MRI of the brain shows multiple T2/FLAIR hyperintense foci in the hemispheres as well in the posterior pons.  About 7-8 of the lesions were enhancing after contrast.  The largest focus is in the left parietal lobe.    MRI of the cervical and thoracic spine also showed foci.  Located at C2, T1, T5 and T11.  None of them and enhanced.   She also had lab work in May and June 2019.  The JCV antibody was positive at 1.56.  The neuromyelitis optica antibody was negative.  REVIEW OF SYSTEMS: Constitutional: No fevers, chills, sweats, or change in appetite.   She has some fatigue. Eyes: No visual changes, double vision, eye pain Ear, nose and throat: No hearing loss, ear pain, nasal  congestion, sore throat Cardiovascular: No chest pain, palpitations Respiratory: No shortness of breath at rest or with exertion.   No wheezes GastrointestinaI: No nausea, vomiting, diarrhea, abdominal pain, fecal incontinence Genitourinary:She has urinary urgency and frequency.  No incontinence.   Musculoskeletal: No neck pain, back pain  Integumentary: No rash, pruritus, skin lesions Neurological: as above Psychiatric: No depression at this time but has felt a little sad since her diagnosis..  No anxiety Endocrine: No palpitations, diaphoresis, change in appetite, change in weigh or increased thirst Hematologic/Lymphatic: No anemia, purpura, petechiae. Allergic/Immunologic: No itchy/runny eyes, nasal congestion, recent allergic reactions, rashes  ALLERGIES: Allergies  Allergen Reactions  . Adderall [Amphetamine-Dextroamphetamine]     Pt feels it caused anxiety attack    HOME MEDICATIONS:  Current Outpatient Medications:  .  Ascorbic Acid (VITA-C PO), Take 75-100 mg by mouth daily., Disp: , Rfl:  .  diclofenac (VOLTAREN) 75 MG EC tablet, Take 1 tablet two times daily as needed for headaches., Disp: 30 tablet, Rfl: 0 .  indomethacin (INDOCIN) 25 MG capsule, Take 25 mg by mouth 2 (two) times daily with a meal., Disp: , Rfl:  .  losartan (COZAAR) 25 MG tablet, Take 50 mg by mouth daily., Disp: , Rfl:  .  MAGNESIUM PO, Take 400-500 mg by mouth daily. 200-242m po bid, Disp: , Rfl:  .  ocrelizumab (OCREVUS) 300 MG/10ML injection, Inject into the vein once., Disp: , Rfl:  .  Omega-3 Fatty Acids (FISH OIL PO), Take by mouth., Disp: , Rfl:  .  sertraline (ZOLOFT) 50 MG tablet, Take 50 mg by mouth daily., Disp: , Rfl:  .  spironolactone (ALDACTONE) 100 MG tablet, Take one tablet by mouth daily, Disp: , Rfl:  .  VITAMIN D PO, Take 5,000 Units by mouth daily., Disp: , Rfl:   PAST MEDICAL HISTORY: Past Medical History:  Diagnosis Date  . Headache   . Hypertension   . [place]   . Vision abnormalities     PAST SURGICAL HISTORY: Past Surgical History:  Procedure Laterality Date  . CERVICAL CONE BIOPSY      FAMILY HISTORY: Family History  Problem Relation Age of Onset  . Hypertension Mother   . Diabetes type II Mother   . Multiple myeloma Mother   . Hypertension Father   . Healthy Sister   . Healthy Brother   . Diabetes Maternal Grandmother   . Diabetes Maternal Grandfather   . Hypertension Paternal Grandmother   . Dementia Paternal Grandmother     SOCIAL HISTORY:  Social History   Socioeconomic History  . Marital status: Married    Spouse name: ADelrae Alfred . Number of children: Not on file  . Years of education: Not on file  . Highest education level: Not on file  Occupational History  . Not on file  Tobacco Use  . Smoking status: Former SResearch scientist (life sciences) . Smokeless tobacco: Never Used  Substance and Sexual Activity  . Alcohol use: Not Currently  . Drug use: Never  . Sexual activity: Not on file  Other Topics Concern  . Not on file  Social History Narrative  . Not on file   Social Determinants of Health   Financial Resource Strain:   . Difficulty of Paying Living Expenses: Not on file  Food Insecurity:   . Worried About RCharity fundraiserin the Last Year: Not on file  . Ran Out of Food in the Last Year: Not on file  Transportation Needs:   . Lack of Transportation (Medical): Not on file  . Lack of Transportation (Non-Medical): Not on file  Physical Activity:   . Days of Exercise per Week: Not on file  . Minutes of Exercise per Session: Not on file  Stress:   . Feeling of Stress :  Not on file  Social Connections:   . Frequency of Communication with Friends and Family: Not on file  . Frequency of Social Gatherings with Friends and Family: Not on file  . Attends Religious Services: Not on file  . Active Member of Clubs or Organizations: Not on file  . Attends Archivist Meetings: Not on file  . Marital Status: Not on  file  Intimate Partner Violence:   . Fear of Current or Ex-Partner: Not on file  . Emotionally Abused: Not on file  . Physically Abused: Not on file  . Sexually Abused: Not on file     PHYSICAL EXAM  Vitals:   03/24/19 1130  BP: 121/82  Pulse: 77  Temp: 97.7 F (36.5 C)  Weight: 153 lb (69.4 kg)  Height: '5\' 2"'  (1.575 m)    Body mass index is 27.98 kg/m.   General: The patient is well-developed and well-nourished and in no acute distress  Skin: Extremities are without rash or edema.  Musculoskeletal:  Back is nontender  Neurologic Exam  Mental status: The patient is alert and oriented x 3 at the time of the examination. The patient has apparent normal recent and remote memory, with an apparently normal attention span and concentration ability.   Speech is normal.  Cranial nerves: Extraocular movements are full.  .  Facial symmetry is present. There is good facial sensation to soft touch bilaterally.Facial strength is normal.  Trapezius and sternocleidomastoid strength is normal. No dysarthria is noted.    No obvious hearing deficits are noted.  Motor:  Muscle bulk is normal.   Tone is normal. Strength is  5 / 5 in all 4 extremities.   Sensory: Sensory testing is intact to pinprick, soft touch and vibration sensation in arms but reduced vibration and touch in left leg relative to the right  Coordination: Cerebellar testing reveals good finger-nose-finger and heel-to-shin bilaterally.  Gait and station: Station is normal.   Gait is normal.  Tandem gait is mildly wide.  Romberg is negative.   Reflexes: Deep tendon reflexes are symmetric and normal bilaterally.     DIAGNOSTIC DATA (LABS, IMAGING, TESTING) - I reviewed patient records, labs, notes, testing and imaging myself where available.  Lab Results  Component Value Date   WBC 6.4 09/21/2018   HGB 12.6 09/21/2018   HCT 38.0 09/21/2018   MCV 83 09/21/2018   PLT 333 09/21/2018      ASSESSMENT AND PLAN      1. Multiple sclerosis ([place])   2. High risk medication use   3. Gait disturbance   4. Other fatigue      1.   Continue Ocrevus.  She had lab work that was normal at the last visit. 2.   Continue Relpax for intermittent headaches.  If they worsen, consider zonisamide. 3.    We discussed better sleep and trying to get 7 hours.   If still tired and sleepy despite 7 hours, then consider modafinil.    4.    She will return in 6 months for regular visit.      Tekeisha Hakim A. Felecia Shelling, MD, PhD, FAAN Certified in Neurology, Clinical Neurophysiology, Sleep Medicine, Pain Medicine and Neuroimaging Director, [place] [facility] [address] [place], [place] [address] [phone]

## 2019-04-22 ENCOUNTER — Telehealth: Payer: Self-pay | Admitting: Neurology

## 2019-04-22 MED ORDER — INDOMETHACIN 25 MG PO CAPS: ORAL_CAPSULE | ORAL | 5 refills | Status: DC

## 2019-04-22 NOTE — Telephone Encounter (Signed)
Needs refill of NSAID.   Indomethacin has helped more than Diclofenac for  Breakthrough migraine

## 2019-05-03 DIAGNOSIS — R109 Unspecified abdominal pain: Secondary | ICD-10-CM | POA: Diagnosis not present

## 2019-05-03 DIAGNOSIS — R197 Diarrhea, unspecified: Secondary | ICD-10-CM | POA: Diagnosis not present

## 2019-05-25 ENCOUNTER — Telehealth: Payer: Self-pay | Admitting: Neurology

## 2019-05-25 NOTE — Telephone Encounter (Signed)
Called pt back. Advised per Dr. Epimenio Foot that she can get any of the three covid-19 vaccines available. Dr. Epimenio Foot would prefer either pfizer or moderna given they are more effective that the [place] and [place] but it is her preference. She verbalized understanding.

## 2019-05-25 NOTE — Telephone Encounter (Signed)
Patient called wanting to know what recommendations provider had for the covid vaccine for her.

## 2019-06-09 ENCOUNTER — Ambulatory Visit: Payer: BC Managed Care – PPO

## 2019-06-11 ENCOUNTER — Ambulatory Visit: Payer: BC Managed Care – PPO | Attending: Internal Medicine

## 2019-06-11 DIAGNOSIS — Z23 Encounter for immunization: Secondary | ICD-10-CM

## 2019-06-11 NOTE — Progress Notes (Signed)
   Covid-19 Vaccination Clinic  Name:  Marissa Andersen    MRN: [id number] DOB: 21-Feb-1985  06/11/2019  Marissa Andersen was observed post Covid-19 immunization for 15 minutes without incident. She was provided with Vaccine Information Sheet and instruction to access the V-Safe system.   Marissa Andersen was instructed to call 911 with any severe reactions post vaccine: [place] Difficulty breathing  . Swelling of face and throat  . A fast heartbeat  . A bad rash all over body  . Dizziness and weakness   Immunizations Administered    Name Date Dose VIS Date Route   Pfizer COVID-19 Vaccine 06/11/2019  1:39 PM 0.3 mL 04/13/2018 Intramuscular   Manufacturer: [employer], [employer]   Lot: [id number]   NDC: [address]-4

## 2019-06-16 DIAGNOSIS — G35 Multiple sclerosis: Secondary | ICD-10-CM | POA: Diagnosis not present

## 2019-06-16 DIAGNOSIS — F419 Anxiety disorder, unspecified: Secondary | ICD-10-CM | POA: Diagnosis not present

## 2019-06-16 DIAGNOSIS — I10 Essential (primary) hypertension: Secondary | ICD-10-CM | POA: Diagnosis not present

## 2019-07-04 ENCOUNTER — Ambulatory Visit: Payer: BC Managed Care – PPO

## 2019-07-07 ENCOUNTER — Ambulatory Visit: Payer: BC Managed Care – PPO | Attending: Internal Medicine

## 2019-07-07 DIAGNOSIS — Z23 Encounter for immunization: Secondary | ICD-10-CM

## 2019-07-07 NOTE — Progress Notes (Signed)
   Covid-19 Vaccination Clinic  Name:  Marissa Andersen    MRN: [id number] DOB: 20-Jul-1985  07/07/2019  [name] was observed post Covid-19 immunization for 15 minutes without incident. She was provided with Vaccine Information Sheet and instruction to access the V-Safe system.   [name] was instructed to call 911 with any severe reactions post vaccine: [place] Difficulty breathing  . Swelling of face and throat  . A fast heartbeat  . A bad rash all over body  . Dizziness and weakness   Immunizations Administered    Name Date Dose VIS Date Route   Pfizer COVID-19 Vaccine 07/07/2019  4:03 PM 0.3 mL 04/13/2018 Intramuscular   Manufacturer: [employer], [employer]   Lot: [id number]   NDC: [address]-2

## 2019-07-21 DIAGNOSIS — Z20822 Contact with and (suspected) exposure to covid-19: Secondary | ICD-10-CM | POA: Diagnosis not present

## 2019-07-21 DIAGNOSIS — Z03818 Encounter for observation for suspected exposure to other biological agents ruled out: Secondary | ICD-10-CM | POA: Diagnosis not present

## 2019-07-25 ENCOUNTER — Telehealth: Payer: Self-pay | Admitting: *Deleted

## 2019-07-25 NOTE — Telephone Encounter (Signed)
Pt called to report she is still having intermittent headaches and they are more frequent. Taking indomethacin and diclofenac but not helping. She has been having them for the past 2-3 weeks. Scheduled f/u for 07/28/19 at 9am. Advised her to check in by 8:30am. She verbalized understanding.  Confirmed she is still on Ocrevus.

## 2019-07-28 ENCOUNTER — Encounter: Payer: Self-pay | Admitting: Neurology

## 2019-07-28 ENCOUNTER — Other Ambulatory Visit: Payer: Self-pay

## 2019-07-28 ENCOUNTER — Ambulatory Visit: Payer: BC Managed Care – PPO | Admitting: Neurology

## 2019-07-28 VITALS — BP 116/66 | HR 72 | Ht 62.0 in | Wt 152.0 lb

## 2019-07-28 DIAGNOSIS — F09 Unspecified mental disorder due to known physiological condition: Secondary | ICD-10-CM

## 2019-07-28 DIAGNOSIS — M542 Cervicalgia: Secondary | ICD-10-CM

## 2019-07-28 DIAGNOSIS — R269 Unspecified abnormalities of gait and mobility: Secondary | ICD-10-CM

## 2019-07-28 DIAGNOSIS — G4452 New daily persistent headache (NDPH): Secondary | ICD-10-CM

## 2019-07-28 DIAGNOSIS — G35 Multiple sclerosis: Secondary | ICD-10-CM | POA: Diagnosis not present

## 2019-07-28 DIAGNOSIS — F419 Anxiety disorder, unspecified: Secondary | ICD-10-CM

## 2019-07-28 DIAGNOSIS — Z79899 Other long term (current) drug therapy: Secondary | ICD-10-CM

## 2019-07-28 DIAGNOSIS — R5383 Other fatigue: Secondary | ICD-10-CM

## 2019-07-28 DIAGNOSIS — R7989 Other specified abnormal findings of blood chemistry: Secondary | ICD-10-CM | POA: Diagnosis not present

## 2019-07-28 MED ORDER — PHENTERMINE HCL 30 MG PO CAPS
30.0000 mg | ORAL_CAPSULE | ORAL | 5 refills | Status: DC
Start: 2019-07-28 — End: 2020-04-02

## 2019-07-28 NOTE — Progress Notes (Signed)
[facility]  PATIENT: Marissa Andersen DOB: [birth year]  REFERRING DOCTOR OR PCP:  Dr. Barrie Folk SOURCE: Patient, notes from Dr. Nestor Ramp, imaging and lab reports, MRI images personally reviewed on PACS.  _________________________________   HISTORICAL  CHIEF COMPLAINT:  Chief Complaint  Patient presents with  . Follow-up    RM 13. Last seen 03/24/2019,   . Multiple Sclerosis    On Ocrevus. Last infusion 03/10/2019 and next one is 09/22/2019.   Marland Kitchen Headache    Here to f/u on ongoing headaches. Indomethacin/diclofenac ineffective.     HISTORY OF PRESENT ILLNESS:  Marissa Andersen is a 34 y.o. woman with relapsing remitting multiple sclerosis.    Update 07/28/2019: She is on Ocrevus as her disease modifying therapy.  Her next infusion will be 09/22/2019.  She has generally tolerated it well.  Most the time, gait, balance are doing well.  Her left leg is numb and this seems worse than earlier in the year.    Her left foot sometimes feels numb.  The left leg also feels a little weak at times -- when weak going downstairs seems unsteady and going up is more difficult..  She denies much urinary urgency at times.  Vision is stable.  She has more fatigue most days.  Her sleep is still irregular. She takes naps if tired.   She snores but no other OSA sign noted by husband.   Mood is doing well.  Cognition is the same with some reduced focus/attention.   Adderall made her feel more anxious  She has had some issues with headaches over the last month. Headaches are mostly left sided and occipital.  She is now having them daily over the last month.  Indomethacin was helping biut then she started having rebound.  Diclofenac had not helped.     Update 03/24/2019: She is on Ocrevus, last infusion 2 weeks ago.   She denies any exacerbation.   Gait, balance, strength and sensation.   She can use the stairs without the bannister.  Her left leg weakness improved.   She gets occasional   Bladder  function is fine.  Vision is stable.  She notes more fatigue.  She stays up until 1-2 am most nights.   She wakes up around 6 am some nights but then is back asleep until 8 am.    She naps some days.    Update 09/21/2018: She is on Ocrevus as a disease modifying therapy for her relapsing remitting MS.  The last infusion was 09/07/2018.  She tolerates it well.   She feels mostly stablebut notes some mild sensory changes in her left leg.  She has had no major issues.   MRI 08/22/2018 showed no new lesions.  She is walking well and easily goes up and down stairs.    She has some numbness in her thighs.    Limbs are strong.   She notes some pain in her joints/limbs.   She has a little tightness in her muscles.    Bladder function is doing well.    Vision is fine.    She notes fatigue but also has had poor sleep many nights getting 6 hours on average.   She has trouble staying asleep.  She denies depression but has anxiety and was placed on sertraline.   Cognition is doing ok--some word finding errors  She has been experiencing headaches only a couple times a month.  Indomethacin worked well caused GI issues so she was switched to  diclofenac.  However, she still takes the indomethacin rather than doclofenac as it is more effective.   If not effective she takes eletriptan if not effective.      Update 02/05/2018: Her first Ocrevus infusions were in late June and early July 2019.  She tolerated the infusions well.  She is scheduled to have her next infusion in about a week.  She denies any exacerbations.    Currently, her gait is doing well.  Strength returned to baseline from her initial exacerbation.  She fatigues easily.   She feels the legs are slightly numb.  She has some bladder urgency but no incontinence.  She does not note any difficulties with her vision.  She had some diplopia that resolved.  I had prescribed Adderall for fatigue and cognitive followed.  She was unable to tolerate it due to feeling  more anxious and having a panic attack a few days after starting   She notes myalgias ans knee and back aches.   She notes anxiety in general more than depression.   She haas had only the one panic attack.   She reports migraine headaches the last 2 weeks but notes more stress with husband out of town most of that time.   HA is bilateral.   Pain is pressure like,  She has mild nausea but no vomiting.   No photophobia.     Moving does not change the intensity.    She was prescribed eletriptan and took 2 the one day with benefit and one yesterday with benefit.      From 10/01/2017: She is a 34 year old woman who was diagnosed with MS in May 2019.  She woke up with a severe headache in January that did not improve.    Then she had dizziness and nausea in January and February 2019.    Her smile seemed distorted and she had riht facial numbness in February 2019.    Around March, she noted difficulty with vision out of the right eye followed by diplopia. In [place] PCP first started to treat her for migraine.   When symptoms persisted, she had a head CT which was read as normal (in retrospect, there is one focus on the left).  At some point, she noted increased urinary frequency,  She saw Dr. Nestor Ramp May 2019.   An MRI was ordered in late May 2019 and she was diagnosed with MS based on the pattern and that multiple lesions enhanced.   She did feel clumsy and fall once in June or July.    She received 5 days of IV Solumedrol in late [place]y, early June.     She was prescribed Ocrevus and had the split dose 08/13/2017 and 08/28/2017.      She feels gait is doing well and is back to baseline.   Strength is back to baseline but she notes she fatigue more quickly climbing stairs.    She denies numbness.   Bladder function is better but she still notes urgency.   No recent incontinence.   Vision is fine now.    Diplopia resolved.    She has had a lot of fatigue the past few months.   She doesn't sleep well some nights  when her husband is out of town (travels every week).    Mood is doing ok.  She denies depression but has been sad a few time.   She feels cognition is worse this year with reduced focus/attention and spme verbal fluency issues.  She is otherwise fairly healthy with benign essential hypertension treated with losartan.   She has no FH of MS.    I personally reviewed the MRIs of the brain and spine performed 07/09/2017 and 07/28/2017.  The MRI of the brain shows multiple T2/FLAIR hyperintense foci in the hemispheres as well in the posterior pons.  About 7-8 of the lesions were enhancing after contrast.  The largest focus is in the left parietal lobe.    MRI of the cervical and thoracic spine also showed foci.  Located at C2, T1, T5 and T11.  None of them and enhanced.   She also had lab work in May and June 2019.  The JCV antibody was positive at 1.56.  The neuromyelitis optica antibody was negative.  REVIEW OF SYSTEMS: Constitutional: No fevers, chills, sweats, or change in appetite.   She has some fatigue. Eyes: No visual changes, double vision, eye pain Ear, nose and throat: No hearing loss, ear pain, nasal congestion, sore throat Cardiovascular: No chest pain, palpitations Respiratory: No shortness of breath at rest or with exertion.   No wheezes GastrointestinaI: No nausea, vomiting, diarrhea, abdominal pain, fecal incontinence Genitourinary:She has urinary urgency and frequency.  No incontinence.   Musculoskeletal: No neck pain, back pain Integumentary: No rash, pruritus, skin lesions Neurological: as above Psychiatric: No depression at this time but has felt a little sad since her diagnosis..  No anxiety Endocrine: No palpitations, diaphoresis, change in appetite, change in weigh or increased thirst Hematologic/Lymphatic: No anemia, purpura, petechiae. Allergic/Immunologic: No itchy/runny eyes, nasal congestion, recent allergic reactions, rashes  ALLERGIES: Allergies  Allergen  Reactions  . Adderall [Amphetamine-Dextroamphetamine]     Pt feels it caused anxiety attack    HOME MEDICATIONS:  Current Outpatient Medications:  .  Ascorbic Acid (VITA-C PO), Take 75-100 mg by mouth daily., Disp: , Rfl:  .  Clindamycin-Benzoyl Per, Refr, gel, APPLY TO FACE EACH MORNING., Disp: , Rfl:  .  diazepam (VALIUM) 5 MG tablet, Take 5 mg by mouth 2 (two) times daily as needed., Disp: , Rfl:  .  indomethacin (INDOCIN) 25 MG capsule, Take once or twice a day with food as needed for migraine, Disp: 30 capsule, Rfl: 5 .  MAGNESIUM PO, Take 400-500 mg by mouth daily. 200-248m po bid, Disp: , Rfl:  .  ocrelizumab (OCREVUS) 300 MG/10ML injection, Inject into the vein once., Disp: , Rfl:  .  Omega-3 Fatty Acids (FISH OIL PO), Take by mouth., Disp: , Rfl:  .  spironolactone (ALDACTONE) 100 MG tablet, Take one tablet by mouth daily, Disp: , Rfl:  .  venlafaxine XR (EFFEXOR-XR) 37.5 MG 24 hr capsule, Take 37.5 mg by mouth daily., Disp: , Rfl:  .  VITAMIN D PO, Take 5,000 Units by mouth daily., Disp: , Rfl:  .  phentermine 30 MG capsule, Take 1 capsule (30 mg total) by mouth every morning., Disp: 30 capsule, Rfl: 5  PAST MEDICAL HISTORY: Past Medical History:  Diagnosis Date  . Headache   . Hypertension   . Multiple sclerosis ([place]   . Vision abnormalities     PAST SURGICAL HISTORY: Past Surgical History:  Procedure Laterality Date  . CERVICAL CONE BIOPSY      FAMILY HISTORY: Family History  Problem Relation Age of Onset  . Hypertension Mother   . Diabetes type II Mother   . Multiple myeloma Mother   . Hypertension Father   . Healthy Sister   . Healthy Brother   . Diabetes Maternal Grandmother   .  Diabetes Maternal Grandfather   . Hypertension Paternal Grandmother   . Dementia Paternal Grandmother     SOCIAL HISTORY:  Social History   Socioeconomic History  . Marital status: Married    Spouse name: Delrae Alfred  . Number of children: 2  . Years of education: Not on  file  . Highest education level: Not on file  Occupational History  . Not on file  Tobacco Use  . Smoking status: Former Research scientist (life sciences)  . Smokeless tobacco: Never Used  Substance and Sexual Activity  . Alcohol use: Not Currently  . Drug use: Never  . Sexual activity: Not on file  Other Topics Concern  . Not on file  Social History Narrative  . Not on file   Social Determinants of Health   Financial Resource Strain:   . Difficulty of Paying Living Expenses:   Food Insecurity:   . Worried About Charity fundraiser in the Last Year:   . Arboriculturist in the Last Year:   Transportation Needs:   . Film/video editor (Medical):   Marland Kitchen Lack of Transportation (Non-Medical):   Physical Activity:   . Days of Exercise per Week:   . Minutes of Exercise per Session:   Stress:   . Feeling of Stress :   Social Connections:   . Frequency of Communication with Friends and Family:   . Frequency of Social Gatherings with Friends and Family:   . Attends Religious Services:   . Active Member of Clubs or Organizations:   . Attends Archivist Meetings:   Marland Kitchen Marital Status:   Intimate Partner Violence:   . Fear of Current or Ex-Partner:   . Emotionally Abused:   Marland Kitchen Physically Abused:   . Sexually Abused:      PHYSICAL EXAM  Vitals:   07/28/19 0845  BP: 116/66  Pulse: 72  Weight: 152 lb (68.9 kg)  Height: '5\' 2"'  (1.575 m)    Body mass index is 27.8 kg/m.   General: The patient is well-developed and well-nourished and in no acute distress  Skin: Extremities are without rash or edema.  Musculoskeletal:  Back is nontender.  Neck has good ROM but is tender on the left over splenius capitus muscle.   Neurologic Exam  Mental status: The patient is alert and oriented x 3 at the time of the examination. The patient has apparent normal recent and remote memory, with an apparently normal attention span and concentration ability.   Speech is normal.  Cranial nerves: Extraocular  movements are full.  .  Facial symmetry is present. There is good facial sensation to soft touch bilaterally.Facial strength is normal.  Trapezius and sternocleidomastoid strength is normal. No dysarthria is noted.    No obvious hearing deficits are noted.  Motor:  Muscle bulk is normal.   Tone is normal. Strength is  5 / 5 in all 4 extremities.   Sensory: Sensory testing is intact to pinprick, soft touch and vibration sensation in arms but reduced vibration and touch in left leg relative to the right  Coordination: Cerebellar testing reveals good finger-nose-finger and heel-to-shin bilaterally.  Gait and station: Station is normal.   Gait is normal.  Tandem gait is mildly wide.  Romberg is negative.   Reflexes: Deep tendon reflexes are symmetric and normal bilaterally.     DIAGNOSTIC DATA (LABS, IMAGING, TESTING) - I reviewed patient records, labs, notes, testing and imaging myself where available.  Lab Results  Component Value Date  WBC 6.4 09/21/2018   HGB 12.6 09/21/2018   HCT 38.0 09/21/2018   MCV 83 09/21/2018   PLT 333 09/21/2018      ASSESSMENT AND PLAN    1. Multiple sclerosis ([place])   2. High risk medication use   3. Low vitamin D level   4. Gait disturbance   5. Other fatigue   6. 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 Anxiety   8. Neck pain   9. New daily persistent headache      1.   Continue Ocrevus.  Check labs.   Due to more left numb.weakness, will check MRI brain and cervical spine 2.   TPI left splenius capitus muscle with 80 mg Depo-medrol in 3 cc Marcaine using sterile technique.    Continue Relpax or indomethacin for intermittent headaches.  If they worsen or persist chronically, consider zonisamide. 3.    We discussed better sleep and trying to get 7 hours.   If still tired and sleepy despite 7 hours, then consider modafinil.    4.   Check vit D and supplement as needed 5.    She will return in 6 months for regular visit.      45-minute office visit with the majority of the time spent face-to-face for history and physical, discussion/counseling and decision-making.  Additional time with record review and documentation.   Dierks Wach A. Felecia Shelling, MD, PhD, FAAN Certified in Neurology, Clinical Neurophysiology, Sleep Medicine, Pain Medicine and Neuroimaging Director, [place] [facility] [address], [place], [place] [address] [phone]

## 2019-08-01 LAB — IGG, IGA, IGM
IgA/Immunoglobulin A, Serum: 142 mg/dL (ref 87–352)
IgG (Immunoglobin G), Serum: 1510 mg/dL (ref 586–1602)
IgM (Immunoglobulin M), Srm: 45 mg/dL (ref 26–217)

## 2019-08-01 LAB — VITAMIN D 25 HYDROXY (VIT D DEFICIENCY, FRACTURES): Vit D, 25-Hydroxy: 42.3 ng/mL (ref 30.0–100.0)

## 2019-08-01 LAB — CD19 AND CD20, FLOW CYTOMETRY

## 2019-08-04 ENCOUNTER — Telehealth: Payer: Self-pay | Admitting: Neurology

## 2019-08-04 NOTE — Telephone Encounter (Signed)
spoke to the patient she will get back to me to schedule   [employer]BS Auth: [id number] (exp. 07/29/19 to 01/24/20)

## 2019-08-17 DIAGNOSIS — G35 Multiple sclerosis: Secondary | ICD-10-CM | POA: Diagnosis not present

## 2019-08-17 DIAGNOSIS — F419 Anxiety disorder, unspecified: Secondary | ICD-10-CM | POA: Diagnosis not present

## 2019-09-02 DIAGNOSIS — N760 Acute vaginitis: Secondary | ICD-10-CM | POA: Diagnosis not present

## 2019-09-02 DIAGNOSIS — R3989 Other symptoms and signs involving the genitourinary system: Secondary | ICD-10-CM | POA: Diagnosis not present

## 2019-09-12 DIAGNOSIS — Z03818 Encounter for observation for suspected exposure to other biological agents ruled out: Secondary | ICD-10-CM | POA: Diagnosis not present

## 2019-09-12 DIAGNOSIS — Z20822 Contact with and (suspected) exposure to covid-19: Secondary | ICD-10-CM | POA: Diagnosis not present

## 2019-09-19 ENCOUNTER — Ambulatory Visit: Payer: BC Managed Care – PPO | Admitting: Family Medicine

## 2019-09-26 ENCOUNTER — Ambulatory Visit: Payer: BC Managed Care – PPO | Admitting: Family Medicine

## 2019-09-29 ENCOUNTER — Telehealth: Payer: Self-pay | Admitting: *Deleted

## 2019-09-29 DIAGNOSIS — Z1152 Encounter for screening for COVID-19: Secondary | ICD-10-CM | POA: Diagnosis not present

## 2019-09-29 DIAGNOSIS — G35 Multiple sclerosis: Secondary | ICD-10-CM | POA: Diagnosis not present

## 2019-09-29 NOTE — Telephone Encounter (Signed)
Pt here today for Ocrevus infusion. Reported to infusion nurse the following: Wanting to start fluconazole today for yeast infection. Had BV a few weeks ago, given cream for that but it gave her a yeast infection. Relayed per Dr. Epimenio Foot, ok for her to start fluconazole.

## 2019-11-23 DIAGNOSIS — R300 Dysuria: Secondary | ICD-10-CM | POA: Diagnosis not present

## 2020-01-24 DIAGNOSIS — Z01419 Encounter for gynecological examination (general) (routine) without abnormal findings: Secondary | ICD-10-CM | POA: Diagnosis not present

## 2020-02-07 DIAGNOSIS — Z03818 Encounter for observation for suspected exposure to other biological agents ruled out: Secondary | ICD-10-CM | POA: Diagnosis not present

## 2020-02-16 DIAGNOSIS — F419 Anxiety disorder, unspecified: Secondary | ICD-10-CM | POA: Diagnosis not present

## 2020-02-16 DIAGNOSIS — Z23 Encounter for immunization: Secondary | ICD-10-CM | POA: Diagnosis not present

## 2020-02-16 DIAGNOSIS — E7800 Pure hypercholesterolemia, unspecified: Secondary | ICD-10-CM | POA: Diagnosis not present

## 2020-02-16 DIAGNOSIS — I10 Essential (primary) hypertension: Secondary | ICD-10-CM | POA: Diagnosis not present

## 2020-02-16 DIAGNOSIS — D849 Immunodeficiency, unspecified: Secondary | ICD-10-CM | POA: Diagnosis not present

## 2020-02-22 DIAGNOSIS — J029 Acute pharyngitis, unspecified: Secondary | ICD-10-CM | POA: Diagnosis not present

## 2020-02-22 DIAGNOSIS — R059 Cough, unspecified: Secondary | ICD-10-CM | POA: Diagnosis not present

## 2020-02-22 DIAGNOSIS — U071 COVID-19: Secondary | ICD-10-CM | POA: Diagnosis not present

## 2020-02-22 DIAGNOSIS — R509 Fever, unspecified: Secondary | ICD-10-CM | POA: Diagnosis not present

## 2020-02-28 ENCOUNTER — Telehealth: Payer: Self-pay

## 2020-02-28 NOTE — Telephone Encounter (Signed)
I connected by phone with Marissa Andersen Steps on 02/28/2020 at 1:59 PM to discuss the potential use of a new treatment for mild to moderate COVID-19 viral infection in non-hospitalized patients.  This patient is a 35 y.o. female that meets the FDA criteria for Emergency Use Authorization of COVID monoclonal antibody sotrovimab.  Has a (+) direct SARS-CoV-2 viral test result  Has mild or moderate COVID-19   Is NOT hospitalized due to COVID-19  Is within 10 days of symptom onset  Has at least one of the high risk factor(s) for progression to severe COVID-19 and/or hospitalization as defined in EUA.  Specific high risk criteria : Other high risk medical condition per CDC:  Multiple sclerosis   I have spoken and communicated the following to the patient or parent/caregiver regarding COVID monoclonal antibody treatment:  1. FDA has authorized the emergency use for the treatment of mild to moderate COVID-19 in adults and pediatric patients with positive results of direct SARS-CoV-2 viral testing who are 28 years of age and older weighing at least 40 kg, and who are at high risk for progressing to severe COVID-19 and/or hospitalization.  2. The significant known and potential risks and benefits of COVID monoclonal antibody, and the extent to which such potential risks and benefits are unknown.  3. Information on available alternative treatments and the risks and benefits of those alternatives, including clinical trials.  4. Patients treated with COVID monoclonal antibody should continue to self-isolate and use infection control measures (e.g., wear mask, isolate, social distance, avoid sharing personal items, clean and disinfect "high touch" surfaces, and frequent handwashing) according to CDC guidelines.   5. The patient or parent/caregiver has the option to accept or refuse COVID monoclonal antibody treatment.  After reviewing this information with the patient, the patient has DECLINED offer to  receive the infusion. Marissa Hardy, RN 02/28/2020 1:59 PM  Symptoms started 02/19/19.

## 2020-03-14 ENCOUNTER — Other Ambulatory Visit: Payer: Self-pay | Admitting: Physician Assistant

## 2020-03-14 ENCOUNTER — Ambulatory Visit
Admission: RE | Admit: 2020-03-14 | Discharge: 2020-03-14 | Disposition: A | Discharge status: Home / Self Care | Payer: BC Managed Care – PPO | Source: Ambulatory Visit | Attending: Physician Assistant | Admitting: Physician Assistant

## 2020-03-14 DIAGNOSIS — R0602 Shortness of breath: Secondary | ICD-10-CM | POA: Diagnosis not present

## 2020-03-26 ENCOUNTER — Telehealth: Payer: Self-pay | Admitting: Neurology

## 2020-03-26 NOTE — Telephone Encounter (Signed)
[email or web address]@ [facility] left a vm asking for a call to discuss this pt.  She is asking to be called at [phone] or [phone], she left vm @9 :45 this morning

## 2020-03-26 NOTE — Telephone Encounter (Signed)
Called back and spoke with Marissa Andersen. Advised this pt is seen by Dr. Epimenio Foot at [facility]. Gets infusion with intrafusion here. Last infusion 09/29/19 and next one 04/10/20. She verbalized understanding. They will update their records.

## 2020-04-02 ENCOUNTER — Ambulatory Visit: Payer: BC Managed Care – PPO | Admitting: Neurology

## 2020-04-02 ENCOUNTER — Other Ambulatory Visit: Payer: Self-pay

## 2020-04-02 ENCOUNTER — Encounter: Payer: Self-pay | Admitting: Neurology

## 2020-04-02 VITALS — BP 121/70 | HR 80 | Ht 62.0 in | Wt 145.0 lb

## 2020-04-02 DIAGNOSIS — Z79899 Other long term (current) drug therapy: Secondary | ICD-10-CM | POA: Diagnosis not present

## 2020-04-02 DIAGNOSIS — R7989 Other specified abnormal findings of blood chemistry: Secondary | ICD-10-CM

## 2020-04-02 DIAGNOSIS — G35 Multiple sclerosis: Secondary | ICD-10-CM

## 2020-04-02 DIAGNOSIS — R269 Unspecified abnormalities of gait and mobility: Secondary | ICD-10-CM

## 2020-04-02 NOTE — Progress Notes (Signed)
[facility]  PATIENT: Marissa Andersen DOB: [birth year]  REFERRING DOCTOR OR PCP:  Dr. Barrie Folk SOURCE: Patient, notes from Dr. Nestor Ramp, imaging and lab reports, MRI images personally reviewed on PACS.  _________________________________   HISTORICAL  CHIEF COMPLAINT:  Chief Complaint  Patient presents with  . Follow-up    RM 12, alone. Last seen 07/28/2019. On Ocrevus for MS. Last: 09/29/19, next: 04/10/20. Tolerating infusion well, no issues.    HISTORY OF PRESENT ILLNESS:  Marissa Andersen is a 35 y.o. woman with relapsing remitting multiple sclerosis.    Update 04/02/2020: She is on Ocrevus as her disease modifying therapy.  Her next infusion will be 04/10/20.  She has generally tolerated it well.    Gait is stable.   No falls.   She will hold the bannister on stairs.   The left leg is numb.   She has some neck pain and shoulder pain.   The left leg is mildly weak -- when weak going downstairs seems unsteady and going up is more difficult..  She denies much urinary urgency at times.  Vision is stable.  She has more fatigue most days.  Her sleep is still irregular. She takes naps if tired.   Denies depression but some anxiety.   Cognition is the same with some reduced focus/attention.    She has had some issues with headaches over the last month. Headaches are mostly left sided and occipital.  She is now having them daily over the last month.  Indomethacin was helping biut then she started having rebound.  Diclofenac had not helped.  She had Covid last month and still feels tired and is not sleeping as well.  She has some shortness of breath, better now.  She worked through her PCP and did not need to go to the ED or get antivirals or mAbs.         MS History:  She was diagnosed with MS in May 2019.    In January 2019, [name] woke up with a severe headache in January that did not improve.    Then she had dizziness and nausea in January and February 2019.    Her smile  seemed distorted and she had right facial numbness in February 2019.    Around March, she noted difficulty with vision out of the right eye followed by diplopia. In [place] PCP first started to treat her for migraine.   When symptoms persisted, she had a head CT which was read as normal (in retrospect, there is one focus on the left).  At some point, she noted increased urinary frequency,  She saw Dr. Nestor Ramp May 2019.   An MRI was ordered in late May 2019 and she was diagnosed with MS based on the pattern and that multiple lesions enhanced.   She did feel clumsy and fall once in June or July.    She received 5 days of IV Solumedrol in late [place], early June.     She was prescribed Ocrevus and had the split dose 08/13/2017 and 08/28/2017.       IMAGING The MRI of the brain 5/23/20219 shows multiple T2/FLAIR hyperintense foci in the hemispheres as well in the posterior pons.  About 7-8 of the lesions were enhancing after contrast.  The largest focus is in the left parietal lobe.      MRI of the cervical and thoracic spine 07/28/2017 showed foci located at C2, T1, T5 and T11.  None of them and enhanced.  She also had lab work in May and June 2019.  The JCV antibody was positive at 1.56.  The neuromyelitis optica antibody was negative.  No FH of MS  REVIEW OF SYSTEMS: Constitutional: No fevers, chills, sweats, or change in appetite.   She has some fatigue. Eyes: No visual changes, double vision, eye pain Ear, nose and throat: No hearing loss, ear pain, nasal congestion, sore throat Cardiovascular: No chest pain, palpitations Respiratory: No shortness of breath at rest or with exertion.   No wheezes GastrointestinaI: No nausea, vomiting, diarrhea, abdominal pain, fecal incontinence Genitourinary:She has urinary urgency and frequency.  No incontinence.   Musculoskeletal: No neck pain, back pain Integumentary: No rash, pruritus, skin lesions Neurological: as above Psychiatric: No depression at this  time but has felt a little sad since her diagnosis..  No anxiety Endocrine: No palpitations, diaphoresis, change in appetite, change in weigh or increased thirst Hematologic/Lymphatic: No anemia, purpura, petechiae. Allergic/Immunologic: No itchy/runny eyes, nasal congestion, recent allergic reactions, rashes  ALLERGIES: Allergies  Allergen Reactions  . Adderall [Amphetamine-Dextroamphetamine]     Pt feels it caused anxiety attack    HOME MEDICATIONS:  Current Outpatient Medications:  .  Clindamycin-Benzoyl Per, Refr, gel, APPLY TO FACE EACH MORNING., Disp: , Rfl:  .  diazepam (VALIUM) 5 MG tablet, Take 5 mg by mouth 2 (two) times daily as needed., Disp: , Rfl:  .  eletriptan (RELPAX) 40 MG tablet, Take 49 mg by mouth as needed., Disp: , Rfl:  .  indomethacin (INDOCIN) 25 MG capsule, Take once or twice a day with food as needed for migraine, Disp: 30 capsule, Rfl: 5 .  ocrelizumab (OCREVUS) 300 MG/10ML injection, Inject into the vein once., Disp: , Rfl:  .  Omega-3 Fatty Acids (FISH OIL PO), Take by mouth., Disp: , Rfl:  .  spironolactone (ALDACTONE) 100 MG tablet, Take one tablet by mouth daily, Disp: , Rfl:  .  venlafaxine (EFFEXOR) 75 MG tablet, Take 75 mg by mouth 2 (two) times daily., Disp: , Rfl:  .  verapamil (CALAN-SR) 120 MG CR tablet, Take 120 mg by mouth daily., Disp: , Rfl:  .  VITAMIN D PO, Take 5,000 Units by mouth daily., Disp: , Rfl:   PAST MEDICAL HISTORY: Past Medical History:  Diagnosis Date  . Headache   . Hypertension   . Multiple sclerosis ([place])   . Vision abnormalities     PAST SURGICAL HISTORY: Past Surgical History:  Procedure Laterality Date  . CERVICAL CONE BIOPSY      FAMILY HISTORY: Family History  Problem Relation Age of Onset  . Hypertension Mother   . Diabetes type II Mother   . Multiple myeloma Mother   . Hypertension Father   . Healthy Sister   . Healthy Brother   . Diabetes Maternal Grandmother   . Diabetes Maternal Grandfather    . Hypertension Paternal Grandmother   . Dementia Paternal Grandmother     SOCIAL HISTORY:  Social History   Socioeconomic History  . Marital status: Married    Spouse name: Delrae Alfred  . Number of children: 2  . Years of education: Not on file  . Highest education level: Not on file  Occupational History  . Not on file  Tobacco Use  . Smoking status: Former Research scientist (life sciences)  . Smokeless tobacco: Never Used  Substance and Sexual Activity  . Alcohol use: Not Currently  . Drug use: Never  . Sexual activity: Not on file  Other Topics Concern  . Not on  file  Social History Narrative  . Not on file   Social Determinants of Health   Financial Resource Strain: Not on file  Food Insecurity: Not on file  Transportation Needs: Not on file  Physical Activity: Not on file  Stress: Not on file  Social Connections: Not on file  Intimate Partner Violence: Not on file     PHYSICAL EXAM  Vitals:   04/02/20 1430  BP: 121/70  Pulse: 80  SpO2: 98%  Weight: 145 lb (65.8 kg)  Height: '5\' 2"'  (1.575 m)    Body mass index is 26.52 kg/m.   General: The patient is well-developed and well-nourished and in no acute distress  Skin: Extremities are without rash or edema.  Musculoskeletal:  Back is nontender.  Neck has good ROM but is tender on the left over splenius capitus muscle.   Neurologic Exam  Mental status: The patient is alert and oriented x 3 at the time of the examination. The patient has apparent normal recent and remote memory, with an apparently normal attention span and concentration ability.   Speech is normal.  Cranial nerves: Extraocular movements are full.  .  Facial symmetry is present. There is good facial sensation to soft touch bilaterally.Facial strength is normal.  Trapezius and sternocleidomastoid strength is normal. No dysarthria is noted.    No obvious hearing deficits are noted.  Motor:  Muscle bulk is normal.   Tone is normal. Strength is  5 / 5 in all 4  extremities.   Sensory: Sensory testing is intact to pinprick, soft touch and vibration sensation in arms but reduced vibration and touch in left leg relative to the right  Coordination: Cerebellar testing reveals good finger-nose-finger and heel-to-shin bilaterally.  Gait and station: Station is normal.   The gait is normal.  Tandem gait is mildly wide..  Romberg is negative.   Reflexes: Deep tendon reflexes are symmetric and normal bilaterally.     DIAGNOSTIC DATA (LABS, IMAGING, TESTING) - I reviewed patient records, labs, notes, testing and imaging myself where available.  Lab Results  Component Value Date   WBC 6.4 09/21/2018   HGB 12.6 09/21/2018   HCT 38.0 09/21/2018   MCV 83 09/21/2018   PLT 333 09/21/2018      ASSESSMENT AND PLAN    1. Multiple sclerosis ([place])   2. High risk medication use   3. Gait disturbance   4. Low vitamin D level      1.   Continue Ocrevus.  Check labs.   We will check an MRI of the brain to determine if there is any subclinical progression and consider a different disease modifying therapy if this is occurring. 2.   Headaches have done well recently 3.   Continue vit D and supplement  5.    She will return in 6 months for regular visit.     45-minute office visit with the majority of the time spent face-to-face for history and physical, discussion/counseling and decision-making.  Additional time with record review and documentation.   [place] Droll A. Felecia Shelling, MD, PhD, FAAN Certified in Neurology, Clinical Neurophysiology, Sleep Medicine, Pain Medicine and Neuroimaging Director, [place] [facility] [address] ounces Kylle Lall OCT different device and that was for the if there is it may just be the visit first you put in something that is respiratory like it.  I will see if maybe a research can see her you just just  just less than an okay yeah that would  be okay this is the okay great thank you [place], [place] [phone]

## 2020-04-03 ENCOUNTER — Telehealth: Payer: Self-pay | Admitting: Neurology

## 2020-04-03 NOTE — Telephone Encounter (Signed)
Sent my chart messaged

## 2020-04-04 DIAGNOSIS — G35 Multiple sclerosis: Secondary | ICD-10-CM | POA: Diagnosis not present

## 2020-04-05 LAB — CD19 AND CD20, FLOW CYTOMETRY

## 2020-04-05 LAB — CBC WITH DIFFERENTIAL/PLATELET
Basophils Absolute: 0 10*3/uL (ref 0.0–0.2)
Basos: 1 %
EOS (ABSOLUTE): 0.1 10*3/uL (ref 0.0–0.4)
Eos: 1 %
Hematocrit: 39.6 % (ref 34.0–46.6)
Hemoglobin: 13.2 g/dL (ref 11.1–15.9)
Immature Grans (Abs): 0 10*3/uL (ref 0.0–0.1)
Immature Granulocytes: 0 %
Lymphocytes Absolute: 1.5 10*3/uL (ref 0.7–3.1)
Lymphs: 23 %
MCH: 27.9 pg (ref 26.6–33.0)
MCHC: 33.3 g/dL (ref 31.5–35.7)
MCV: 84 fL (ref 79–97)
Monocytes Absolute: 0.5 10*3/uL (ref 0.1–0.9)
Monocytes: 7 %
Neutrophils Absolute: 4.3 10*3/uL (ref 1.4–7.0)
Neutrophils: 68 %
Platelets: 302 10*3/uL (ref 150–450)
RBC: 4.73 x10E6/uL (ref 3.77–5.28)
RDW: 12 % (ref 11.7–15.4)
WBC: 6.4 10*3/uL (ref 3.4–10.8)

## 2020-04-05 LAB — IGG, IGA, IGM
IgA/Immunoglobulin A, Serum: 126 mg/dL (ref 87–352)
IgG (Immunoglobin G), Serum: 1323 mg/dL (ref 586–1602)
IgM (Immunoglobulin M), Srm: 39 mg/dL (ref 26–217)

## 2020-04-10 DIAGNOSIS — G40909 Epilepsy, unspecified, not intractable, without status epilepticus: Secondary | ICD-10-CM | POA: Diagnosis not present

## 2020-04-10 DIAGNOSIS — G35 Multiple sclerosis: Secondary | ICD-10-CM | POA: Diagnosis not present

## 2020-04-10 DIAGNOSIS — Z79899 Other long term (current) drug therapy: Secondary | ICD-10-CM | POA: Diagnosis not present

## 2020-05-08 DIAGNOSIS — I10 Essential (primary) hypertension: Secondary | ICD-10-CM | POA: Diagnosis not present

## 2020-05-08 DIAGNOSIS — G43109 Migraine with aura, not intractable, without status migrainosus: Secondary | ICD-10-CM | POA: Diagnosis not present

## 2020-05-08 DIAGNOSIS — F419 Anxiety disorder, unspecified: Secondary | ICD-10-CM | POA: Diagnosis not present

## 2020-05-08 DIAGNOSIS — E7800 Pure hypercholesterolemia, unspecified: Secondary | ICD-10-CM | POA: Diagnosis not present

## 2020-05-19 ENCOUNTER — Other Ambulatory Visit: Payer: Self-pay | Admitting: Neurology

## 2020-05-28 ENCOUNTER — Other Ambulatory Visit: Payer: Self-pay | Admitting: *Deleted

## 2020-05-28 MED ORDER — INDOMETHACIN 25 MG PO CAPS: ORAL_CAPSULE | ORAL | 5 refills | Status: DC

## 2020-08-01 DIAGNOSIS — M25561 Pain in right knee: Secondary | ICD-10-CM | POA: Diagnosis not present

## 2020-08-01 DIAGNOSIS — R208 Other disturbances of skin sensation: Secondary | ICD-10-CM | POA: Diagnosis not present

## 2020-08-31 ENCOUNTER — Other Ambulatory Visit: Payer: Self-pay | Admitting: Neurology

## 2020-09-17 ENCOUNTER — Other Ambulatory Visit: Payer: Self-pay | Admitting: Physician Assistant

## 2020-09-17 DIAGNOSIS — N63 Unspecified lump in unspecified breast: Secondary | ICD-10-CM | POA: Diagnosis not present

## 2020-09-17 DIAGNOSIS — N939 Abnormal uterine and vaginal bleeding, unspecified: Secondary | ICD-10-CM | POA: Diagnosis not present

## 2020-09-17 DIAGNOSIS — N6310 Unspecified lump in the right breast, unspecified quadrant: Secondary | ICD-10-CM

## 2020-09-18 ENCOUNTER — Telehealth: Payer: Self-pay | Admitting: Neurology

## 2020-09-18 NOTE — Telephone Encounter (Signed)
Pt called, how much time between covid booster and infusion. Would like a call from the nurse.

## 2020-09-18 NOTE — Telephone Encounter (Signed)
Called pt back. Advised she should wait at least 2 weeks after getting covid booster before getting Ocrevus infusion. She verbalized understanding.

## 2020-09-21 ENCOUNTER — Ambulatory Visit
Admission: RE | Admit: 2020-09-21 | Discharge: 2020-09-21 | Disposition: A | Discharge status: Home / Self Care | Payer: BC Managed Care – PPO | Source: Ambulatory Visit | Attending: Physician Assistant | Admitting: Physician Assistant

## 2020-09-21 ENCOUNTER — Other Ambulatory Visit: Payer: Self-pay

## 2020-09-21 ENCOUNTER — Other Ambulatory Visit: Payer: Self-pay | Admitting: Physician Assistant

## 2020-09-21 DIAGNOSIS — N6310 Unspecified lump in the right breast, unspecified quadrant: Secondary | ICD-10-CM

## 2020-09-21 DIAGNOSIS — N6311 Unspecified lump in the right breast, upper outer quadrant: Secondary | ICD-10-CM | POA: Diagnosis not present

## 2020-09-21 DIAGNOSIS — R599 Enlarged lymph nodes, unspecified: Secondary | ICD-10-CM

## 2020-09-21 DIAGNOSIS — R922 Inconclusive mammogram: Secondary | ICD-10-CM | POA: Diagnosis not present

## 2020-10-02 ENCOUNTER — Ambulatory Visit
Admission: RE | Admit: 2020-10-02 | Discharge: 2020-10-02 | Disposition: A | Discharge status: Home / Self Care | Payer: BC Managed Care – PPO | Source: Ambulatory Visit | Attending: Physician Assistant | Admitting: Physician Assistant

## 2020-10-02 ENCOUNTER — Other Ambulatory Visit: Payer: Self-pay | Admitting: Physician Assistant

## 2020-10-02 ENCOUNTER — Other Ambulatory Visit: Payer: Self-pay

## 2020-10-02 DIAGNOSIS — R590 Localized enlarged lymph nodes: Secondary | ICD-10-CM | POA: Diagnosis not present

## 2020-10-02 DIAGNOSIS — N6310 Unspecified lump in the right breast, unspecified quadrant: Secondary | ICD-10-CM

## 2020-10-02 DIAGNOSIS — N61 Mastitis without abscess: Secondary | ICD-10-CM | POA: Diagnosis not present

## 2020-10-02 DIAGNOSIS — R599 Enlarged lymph nodes, unspecified: Secondary | ICD-10-CM

## 2020-10-02 DIAGNOSIS — R928 Other abnormal and inconclusive findings on diagnostic imaging of breast: Secondary | ICD-10-CM | POA: Diagnosis not present

## 2020-10-02 DIAGNOSIS — N6315 Unspecified lump in the right breast, overlapping quadrants: Secondary | ICD-10-CM | POA: Diagnosis not present

## 2020-10-23 DIAGNOSIS — G35 Multiple sclerosis: Secondary | ICD-10-CM | POA: Diagnosis not present

## 2020-10-25 ENCOUNTER — Telehealth: Payer: Self-pay | Admitting: Neurology

## 2020-10-25 MED ORDER — ONDANSETRON HCL 4 MG PO TABS: 4.0000 mg | ORAL_TABLET | Freq: Three times a day (TID) | ORAL | 0 refills | Status: DC | PRN

## 2020-10-25 NOTE — Telephone Encounter (Signed)
FYI- pt wanted to let doctor know she did have a reaction to her infusion treatment that she had on Tuesday. Very nauseous for the most part, wondering what she should do. Pt requesting a call back.

## 2020-10-25 NOTE — Telephone Encounter (Signed)
Called pt back. She had Ocrevus infusion 10/23/20 at 9am and was gone by 12:30pm. Starting yesterday, she was feeling very nauseous/dizzy/headache/fatigue. Feels the same today. Denies any signs of infection. No fever/drainage.  No new meds. I placed her on hold and spoke w/ MD. He does not think this is r/t to infusion. Recommends Ondansetron 4mg  po q 8hr prn. #20, 0 refills. I relayed this to pt. She is agreeable to try this. I e-scribed to [facility]. She will continue to monitor sx. If she is unable to manage sx at home w/ OTC meds, she will contact PCP for further work up.

## 2020-10-26 ENCOUNTER — Other Ambulatory Visit: Payer: Self-pay | Admitting: Surgery

## 2020-10-26 DIAGNOSIS — N61 Mastitis without abscess: Secondary | ICD-10-CM | POA: Diagnosis not present

## 2020-11-02 DIAGNOSIS — N6121 Granulomatous mastitis, right breast: Secondary | ICD-10-CM | POA: Diagnosis not present

## 2020-11-13 DIAGNOSIS — N6121 Granulomatous mastitis, right breast: Secondary | ICD-10-CM | POA: Diagnosis not present

## 2020-11-16 DIAGNOSIS — N6121 Granulomatous mastitis, right breast: Secondary | ICD-10-CM | POA: Diagnosis not present

## 2020-11-20 DIAGNOSIS — N6011 Diffuse cystic mastopathy of right breast: Secondary | ICD-10-CM | POA: Diagnosis not present

## 2020-11-26 ENCOUNTER — Other Ambulatory Visit: Payer: Self-pay | Admitting: Surgery

## 2020-11-26 DIAGNOSIS — N6011 Diffuse cystic mastopathy of right breast: Secondary | ICD-10-CM | POA: Diagnosis not present

## 2020-11-28 ENCOUNTER — Encounter: Payer: Self-pay | Admitting: Neurology

## 2020-11-29 NOTE — Progress Notes (Signed)
Surgical Instructions    Your procedure is scheduled on 12/06/20.  Report to [facility] Main Entrance "A" at 10:45 A.M., then check in with the Admitting office.  Call this number if you have problems the morning of surgery:  [phone]   If you have any questions prior to your surgery date call [phone]: Open Monday-Friday 8am-4pm    Remember:  Do not eat after midnight the night before your surgery  You may drink clear liquids until 09:45am the morning of your surgery.   Clear liquids allowed are: Water, Non-Citrus Juices (without pulp), Carbonated Beverages, Clear Tea, Black Coffee ONLY (NO MILK, CREAM OR POWDERED CREAMER of any kind), and Gatorade    Take these medicines the morning of surgery with A SIP OF WATER  venlafaxine XR (EFFEXOR-XR) verapamil (CALAN-SR)   IF NEEDED: diazepam (VALIUM)  eletriptan (RELPAX ondansetron (ZOFRAN)  As of today, STOP taking any Aspirin (unless otherwise instructed by your surgeon) Aleve, Naproxen, Ibuprofen, Motrin, Advil, Goody's, BC's, all herbal medications, fish oil, indomethacin (INDOCIN) and all vitamins.     After your COVID test   You are not required to quarantine however you are required to wear a well-fitting mask when you are out and around people not in your household.  If your mask becomes wet or soiled, replace with a new one.  Wash your hands often with soap and water for 20 seconds or clean your hands with an alcohol-based hand sanitizer that contains at least 60% alcohol.  Do not share personal items.  Notify your provider: if you are in close contact with someone who has COVID  or if you develop a fever of 100.4 or greater, sneezing, cough, sore throat, shortness of breath or body aches.             Do not wear jewelry or makeup Do not wear lotions, powders, perfumes/colognes, or deodorant. Do not shave 48 hours prior to surgery.   Do not bring valuables to the hospital. DO Not wear nail polish, gel  polish, artificial nails, or any other type of covering on natural nails including finger and toenails. If patients have artificial nails, gel coating, etc. that need to be removed by a nail salon, please have this removed prior to surgery or surgery may need to be canceled/delayed if the surgeon/ anesthesia feels like the patient is unable to be adequately monitored.              is not responsible for any belongings or valuables.  Do NOT Smoke (Tobacco/Vaping)  24 hours prior to your procedure  If you use a CPAP at night, you may bring your mask for your overnight stay.   Contacts, glasses, hearing aids, dentures or partials may not be worn into surgery, please bring cases for these belongings   For patients admitted to the hospital, discharge time will be determined by your treatment team.   Patients discharged the day of surgery will not be allowed to drive home, and someone needs to stay with them for 24 hours.  NO VISITORS WILL BE ALLOWED IN PRE-OP WHERE PATIENTS ARE PREPPED FOR SURGERY.  ONLY 1 SUPPORT PERSON MAY BE PRESENT IN THE WAITING ROOM WHILE YOU ARE IN SURGERY.  IF YOU ARE TO BE ADMITTED, ONCE YOU ARE IN YOUR ROOM YOU WILL BE ALLOWED TWO (2) VISITORS. 1 (ONE) VISITOR MAY STAY OVERNIGHT BUT MUST ARRIVE TO THE ROOM BY 8pm.  Minor children may have two parents present. Special consideration for safety  and communication needs will be reviewed on a case by case basis.  Special instructions:    Oral Hygiene is also important to reduce your risk of infection.  Remember - BRUSH YOUR TEETH THE MORNING OF SURGERY WITH YOUR REGULAR TOOTHPASTE   [place]- Preparing For Surgery  Before surgery, you can play an important role. Because skin is not sterile, your skin needs to be as free of germs as possible. You can reduce the number of germs on your skin by washing with CHG (chlorahexidine gluconate) Soap before surgery.  CHG is an antiseptic cleaner which kills germs and bonds  with the skin to continue killing germs even after washing.     Please do not use if you have an allergy to CHG or antibacterial soaps. If your skin becomes reddened/irritated stop using the CHG.  Do not shave (including legs and underarms) for at least 48 hours prior to first CHG shower. It is OK to shave your face.  Please follow these instructions carefully.     Shower the NIGHT BEFORE SURGERY and the MORNING OF SURGERY with CHG Soap.   If you chose to wash your hair, wash your hair first as usual with your normal shampoo. After you shampoo, rinse your hair and body thoroughly to remove the shampoo.  Then [employer] and genitals (private parts) with your normal soap and rinse thoroughly to remove soap.  After that Use CHG Soap as you would any other liquid soap. You can apply CHG directly to the skin and wash gently with a scrungie or a clean washcloth.   Apply the CHG Soap to your body ONLY FROM THE NECK DOWN.  Do not use on open wounds or open sores. Avoid contact with your eyes, ears, mouth and genitals (private parts). Wash Face and genitals (private parts)  with your normal soap.   Wash thoroughly, paying special attention to the area where your surgery will be performed.  Thoroughly rinse your body with warm water from the neck down.  DO NOT shower/wash with your normal soap after using and rinsing off the CHG Soap.  Pat yourself dry with a CLEAN TOWEL.  Wear CLEAN PAJAMAS to bed the night before surgery  Place CLEAN SHEETS on your bed the night before your surgery  DO NOT SLEEP WITH PETS.   Day of Surgery: Take a shower with CHG soap. Wear Clean/Comfortable clothing the morning of surgery Do not apply any deodorants/lotions.   Remember to brush your teeth WITH YOUR REGULAR TOOTHPASTE.   Please read over the following fact sheets that you were given.

## 2020-11-30 ENCOUNTER — Encounter (HOSPITAL_COMMUNITY): Payer: Self-pay

## 2020-11-30 ENCOUNTER — Other Ambulatory Visit: Payer: Self-pay

## 2020-11-30 ENCOUNTER — Encounter (HOSPITAL_COMMUNITY)
Admission: RE | Admit: 2020-11-30 | Discharge: 2020-11-30 | Disposition: A | Discharge status: Home / Self Care | Payer: BC Managed Care – PPO | Source: Ambulatory Visit | Attending: Surgery | Admitting: Surgery

## 2020-11-30 DIAGNOSIS — Z01818 Encounter for other preprocedural examination: Secondary | ICD-10-CM | POA: Diagnosis not present

## 2020-11-30 LAB — BASIC METABOLIC PANEL
Anion gap: 6 (ref 5–15)
BUN: 10 mg/dL (ref 6–20)
CO2: 27 mmol/L (ref 22–32)
Calcium: 8.7 mg/dL — ABNORMAL LOW (ref 8.9–10.3)
Chloride: 104 mmol/L (ref 98–111)
Creatinine, Ser: 0.75 mg/dL (ref 0.44–1.00)
GFR, Estimated: >60 mL/min (ref >60)
Glucose, Bld: 84 mg/dL (ref 70–99)
Potassium: 4 mmol/L (ref 3.5–5.1)
Sodium: 137 mmol/L (ref 135–145)

## 2020-11-30 LAB — CBC
HCT: 40.4 % (ref 36.0–46.0)
Hemoglobin: 12.8 g/dL (ref 12.0–15.0)
MCH: 27.6 pg (ref 26.0–34.0)
MCHC: 31.7 g/dL (ref 30.0–36.0)
MCV: 87.3 fL (ref 80.0–100.0)
Platelets: 306 10*3/uL (ref 150–400)
RBC: 4.63 MIL/uL (ref 3.87–5.11)
RDW: 13 % (ref 11.5–15.5)
WBC: 5.3 10*3/uL (ref 4.0–10.5)
nRBC: 0 % (ref 0.0–0.2)

## 2020-11-30 LAB — SURGICAL PCR SCREEN
MRSA, PCR: NEGATIVE
Staphylococcus aureus: NEGATIVE

## 2020-11-30 NOTE — Progress Notes (Signed)
PCP: Elaina Hoops, PA Cardiologist: denies  EKG: 11/30/20 CXR: na ECHO: denies Stress Test: denies Cardiac Cath: denies  Fasting Blood Sugar- na Checks Blood Sugar__na_ times a day  OSA/CPAP: No  ASA/Blood Thinner: No  Covid test not needed  Anesthesia Review: No  Patient denies shortness of breath, fever, cough, and chest pain at PAT appointment.  Patient verbalized understanding of instructions provided today at the PAT appointment.  Patient asked to review instructions at home and day of surgery.

## 2020-12-05 NOTE — H&P (Signed)
   PROVIDER: Wayne Both, MD  MRN: [id number] DOB: 06/05/1985 DATE OF ENCOUNTER: 11/26/2020 Subjective   Chief Complaint: No chief complaint on file.   History of Present Illness: Marissa Andersen is a 35 y.o. female who is seen today for her continued right breast mastitis.  She continues to have drainage from the small open wound and reports that the nodules have persisted. She has been off of antibiotics and steroids and has not had any improvement in the nodules in the breast. She has mild discomfort.  Review of Systems: A complete review of systems was obtained from the patient. I have reviewed this information and discussed as appropriate with the patient. See HPI as well for other ROS.  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)   Gait disturbance   High risk medication use   Hyperpigmentation   Intrinsic atopic dermatitis   Low vitamin D level   Multiple sclerosis (CMS-HCC)   Neck pain   New daily persistent headache   Other fatigue   Past Surgical History:  Procedure Laterality Date   Biopsy Surgery 07/2016    Allergies  Allergen Reactions   Dextroamphetamine-Amphetamine Unknown  Pt feels it caused anxiety attack   Current Outpatient Medications on File Prior to Visit  Medication Sig Dispense Refill   spironolactone (ALDACTONE) 100 MG tablet Take 1 tablet by mouth once daily   No current facility-administered medications on file prior to visit.   History reviewed. No pertinent family history.   Social History   Tobacco Use  Smoking Status Never Smoker  Smokeless Tobacco Never Used    Social History   Socioeconomic History   Marital status: Married  Tobacco Use   Smoking status: Never Smoker   Smokeless tobacco: Never Used  Building services engineer Use: Never used  Substance  and Sexual Activity   Alcohol use: Not Currently   Drug use: Never   Objective:   Vitals:  11/26/20 0951  Weight: 69.3 kg (152 lb 12.8 oz)  Height: 157.5 cm (5\' 2" )   Body mass index is 27.95 kg/m.  Physical Exam   She appears well on exam  She still has a small open wound on the right breast and chronic skin changes as well as several firm palpable nodules in the upper outer quadrant of the right breast. There is no current purulence or erythema  Labs, Imaging and Diagnostic Testing:  Assessment and Plan:  Diagnoses and all orders for this visit:  Chronic mastitis of right breast    At this point, she has failed antibiotics and steroids as well as local wound care. I discussed this with her in detail. We will now proceed with a right breast lumpectomy. This would allow complete histologic evaluation trial malignancy and to get back to healthy breast tissue regarding her open wound. I explained that we would still need to close this with sutures on the outside or even a drain as opposed to subcuticular sutures. I explained the surgical procedure in detail. I explained the risks which include but is not limited to bleeding, ongoing infection, having a chronic open wound, the need for other procedures, etc. She understands and wished to proceed with surgery which will be scheduled

## 2020-12-06 ENCOUNTER — Other Ambulatory Visit: Payer: Self-pay

## 2020-12-06 ENCOUNTER — Ambulatory Visit (HOSPITAL_BASED_OUTPATIENT_CLINIC_OR_DEPARTMENT_OTHER): Payer: BC Managed Care – PPO | Admitting: Certified Registered"

## 2020-12-06 ENCOUNTER — Encounter (HOSPITAL_BASED_OUTPATIENT_CLINIC_OR_DEPARTMENT_OTHER)
Admission: RE | Disposition: A | Discharge status: Home / Self Care | Payer: Self-pay | Source: Home / Self Care | Attending: Surgery

## 2020-12-06 ENCOUNTER — Ambulatory Visit (HOSPITAL_BASED_OUTPATIENT_CLINIC_OR_DEPARTMENT_OTHER)
Admission: RE | Admit: 2020-12-06 | Discharge: 2020-12-06 | Disposition: A | Discharge status: Home / Self Care | Payer: BC Managed Care – PPO | Attending: Surgery | Admitting: Surgery

## 2020-12-06 ENCOUNTER — Encounter (HOSPITAL_BASED_OUTPATIENT_CLINIC_OR_DEPARTMENT_OTHER): Payer: Self-pay | Admitting: Surgery

## 2020-12-06 DIAGNOSIS — E559 Vitamin D deficiency, unspecified: Secondary | ICD-10-CM | POA: Diagnosis not present

## 2020-12-06 DIAGNOSIS — G35 Multiple sclerosis: Secondary | ICD-10-CM | POA: Diagnosis not present

## 2020-12-06 DIAGNOSIS — N61 Mastitis without abscess: Secondary | ICD-10-CM | POA: Diagnosis not present

## 2020-12-06 DIAGNOSIS — Z79899 Other long term (current) drug therapy: Secondary | ICD-10-CM | POA: Diagnosis not present

## 2020-12-06 DIAGNOSIS — Z888 Allergy status to other drugs, medicaments and biological substances status: Secondary | ICD-10-CM | POA: Insufficient documentation

## 2020-12-06 DIAGNOSIS — Z87891 Personal history of nicotine dependence: Secondary | ICD-10-CM | POA: Insufficient documentation

## 2020-12-06 DIAGNOSIS — N6011 Diffuse cystic mastopathy of right breast: Secondary | ICD-10-CM | POA: Diagnosis not present

## 2020-12-06 HISTORY — PX: BREAST LUMPECTOMY: SHX2

## 2020-12-06 LAB — POCT PREGNANCY, URINE: Preg Test, Ur: NEGATIVE

## 2020-12-06 SURGERY — BREAST LUMPECTOMY
Anesthesia: General | Site: Breast | Laterality: Right

## 2020-12-06 MED ORDER — DEXAMETHASONE SODIUM PHOSPHATE 10 MG/ML IJ SOLN
INTRAMUSCULAR | Status: DC | PRN
  Administered 2020-12-06: 10 mg via INTRAVENOUS

## 2020-12-06 MED ORDER — BUPIVACAINE-EPINEPHRINE 0.5% -1:200000 IJ SOLN
INTRAMUSCULAR | Status: DC | PRN
  Administered 2020-12-06: 20 mL

## 2020-12-06 MED ORDER — MIDAZOLAM HCL 5 MG/5ML IJ SOLN
INTRAMUSCULAR | Status: DC | PRN
  Administered 2020-12-06: 2 mg via INTRAVENOUS

## 2020-12-06 MED ORDER — FENTANYL CITRATE (PF) 100 MCG/2ML IJ SOLN: 25.0000 ug | INTRAMUSCULAR | Status: DC | PRN

## 2020-12-06 MED ORDER — LACTATED RINGERS IV SOLN: INTRAVENOUS | Status: DC

## 2020-12-06 MED ORDER — OXYCODONE HCL 5 MG PO TABS: 5.0000 mg | ORAL_TABLET | Freq: Four times a day (QID) | ORAL | 0 refills | Status: DC | PRN

## 2020-12-06 MED ORDER — ACETAMINOPHEN 500 MG PO TABS
1000.0000 mg | ORAL_TABLET | ORAL | Status: AC
  Administered 2020-12-06: 1000 mg via ORAL

## 2020-12-06 MED ORDER — CEFAZOLIN SODIUM-DEXTROSE 2-4 GM/100ML-% IV SOLN
2.0000 g | INTRAVENOUS | Status: AC
  Administered 2020-12-06: 2 g via INTRAVENOUS

## 2020-12-06 MED ORDER — FENTANYL CITRATE (PF) 100 MCG/2ML IJ SOLN
INTRAMUSCULAR | Status: DC | PRN
  Administered 2020-12-06: 50 ug via INTRAVENOUS

## 2020-12-06 MED ORDER — OXYCODONE HCL 5 MG PO TABS
ORAL_TABLET | ORAL | Status: AC
  Filled 2020-12-06: qty 1

## 2020-12-06 MED ORDER — CEFAZOLIN SODIUM-DEXTROSE 2-4 GM/100ML-% IV SOLN
INTRAVENOUS | Status: AC
  Filled 2020-12-06: qty 100

## 2020-12-06 MED ORDER — ACETAMINOPHEN 500 MG PO TABS
ORAL_TABLET | ORAL | Status: AC
  Filled 2020-12-06: qty 2

## 2020-12-06 MED ORDER — CHLORHEXIDINE GLUCONATE CLOTH 2 % EX PADS: 6.0000 | MEDICATED_PAD | Freq: Once | CUTANEOUS | Status: DC

## 2020-12-06 MED ORDER — ONDANSETRON HCL 4 MG/2ML IJ SOLN
INTRAMUSCULAR | Status: AC
  Filled 2020-12-06: qty 2

## 2020-12-06 MED ORDER — ONDANSETRON HCL 4 MG/2ML IJ SOLN
INTRAMUSCULAR | Status: DC | PRN
  Administered 2020-12-06: 4 mg via INTRAVENOUS

## 2020-12-06 MED ORDER — 0.9 % SODIUM CHLORIDE (POUR BTL) OPTIME
TOPICAL | Status: DC | PRN
  Administered 2020-12-06: 300 mL

## 2020-12-06 MED ORDER — OXYCODONE HCL 5 MG PO TABS
5.0000 mg | ORAL_TABLET | Freq: Once | ORAL | Status: AC
  Administered 2020-12-06: 5 mg via ORAL

## 2020-12-06 MED ORDER — LIDOCAINE 2% (20 MG/ML) 5 ML SYRINGE
INTRAMUSCULAR | Status: AC
  Filled 2020-12-06: qty 5

## 2020-12-06 MED ORDER — FENTANYL CITRATE (PF) 100 MCG/2ML IJ SOLN
INTRAMUSCULAR | Status: AC
  Filled 2020-12-06: qty 2

## 2020-12-06 MED ORDER — PROPOFOL 10 MG/ML IV BOLUS
INTRAVENOUS | Status: AC
  Filled 2020-12-06: qty 20

## 2020-12-06 MED ORDER — LIDOCAINE HCL (CARDIAC) PF 100 MG/5ML IV SOSY
PREFILLED_SYRINGE | INTRAVENOUS | Status: DC | PRN
  Administered 2020-12-06: 60 mg via INTRAVENOUS

## 2020-12-06 MED ORDER — PROMETHAZINE HCL 25 MG/ML IJ SOLN: 6.2500 mg | INTRAMUSCULAR | Status: DC | PRN

## 2020-12-06 MED ORDER — PROPOFOL 10 MG/ML IV BOLUS
INTRAVENOUS | Status: DC | PRN
  Administered 2020-12-06: 150 mg via INTRAVENOUS

## 2020-12-06 MED ORDER — MIDAZOLAM HCL 2 MG/2ML IJ SOLN
INTRAMUSCULAR | Status: AC
  Filled 2020-12-06: qty 2

## 2020-12-06 SURGICAL SUPPLY — 40 items
ADH SKN CLS APL DERMABOND .7 (GAUZE/BANDAGES/DRESSINGS) ×1
APL PRP STRL LF DISP 70% ISPRP (MISCELLANEOUS) ×1
BLADE SURG 15 STRL LF DISP TIS (BLADE) ×1 IMPLANT
BLADE SURG 15 STRL SS (BLADE) ×2
CHLORAPREP W/TINT 26 (MISCELLANEOUS) ×2 IMPLANT
COVER BACK TABLE 60X90IN (DRAPES) ×2 IMPLANT
COVER MAYO STAND STRL (DRAPES) ×2 IMPLANT
DECANTER SPIKE VIAL GLASS SM (MISCELLANEOUS) IMPLANT
DERMABOND ADVANCED (GAUZE/BANDAGES/DRESSINGS) ×1
DERMABOND ADVANCED .7 DNX12 (GAUZE/BANDAGES/DRESSINGS) ×1 IMPLANT
DRAPE LAPAROTOMY 100X72 PEDS (DRAPES) ×2 IMPLANT
DRAPE UTILITY XL STRL (DRAPES) ×2 IMPLANT
ELECT REM PT RETURN 9FT ADLT (ELECTROSURGICAL) ×2
ELECTRODE REM PT RTRN 9FT ADLT (ELECTROSURGICAL) ×1 IMPLANT
GAUZE SPONGE 4X4 12PLY STRL LF (GAUZE/BANDAGES/DRESSINGS) ×2 IMPLANT
GLOVE SURG POLYISO LF SZ6.5 (GLOVE) ×1 IMPLANT
GLOVE SURG POLYISO LF SZ7 (GLOVE) ×1 IMPLANT
GLOVE SURG SIGNA 7.5 PF LTX (GLOVE) ×2 IMPLANT
GLOVE SURG UNDER POLY LF SZ6.5 (GLOVE) ×1 IMPLANT
GOWN STRL REUS W/ TWL LRG LVL3 (GOWN DISPOSABLE) ×1 IMPLANT
GOWN STRL REUS W/ TWL XL LVL3 (GOWN DISPOSABLE) ×1 IMPLANT
GOWN STRL REUS W/TWL LRG LVL3 (GOWN DISPOSABLE) ×2
GOWN STRL REUS W/TWL XL LVL3 (GOWN DISPOSABLE) ×2
KIT MARKER MARGIN INK (KITS) ×1 IMPLANT
NDL HYPO 25X1 1.5 SAFETY (NEEDLE) ×1 IMPLANT
NEEDLE HYPO 25X1 1.5 SAFETY (NEEDLE) ×2 IMPLANT
NS IRRIG 1000ML POUR BTL (IV SOLUTION) ×2 IMPLANT
PACK BASIN DAY SURGERY FS (CUSTOM PROCEDURE TRAY) ×2 IMPLANT
PENCIL SMOKE EVACUATOR (MISCELLANEOUS) ×2 IMPLANT
SLEEVE SCD COMPRESS KNEE MED (STOCKING) IMPLANT
SPONGE T-LAP 4X18 ~~LOC~~+RFID (SPONGE) ×2 IMPLANT
SUT MNCRL AB 4-0 PS2 18 (SUTURE) ×2 IMPLANT
SUT SILK 2 0 SH (SUTURE) ×1 IMPLANT
SUT VIC AB 3-0 SH 27 (SUTURE) ×2
SUT VIC AB 3-0 SH 27X BRD (SUTURE) ×1 IMPLANT
SYR CONTROL 10ML LL (SYRINGE) ×2 IMPLANT
TOWEL GREEN STERILE FF (TOWEL DISPOSABLE) ×2 IMPLANT
TRAY FAXITRON CT DISP (TRAY / TRAY PROCEDURE) IMPLANT
TUBE CONNECTING 20X1/4 (TUBING) ×1 IMPLANT
YANKAUER SUCT BULB TIP NO VENT (SUCTIONS) ×1 IMPLANT

## 2020-12-06 NOTE — Discharge Instructions (Addendum)
[facility] Office Phone Number [phone]  BREAST BIOPSY/ PARTIAL MASTECTOMY: POST OP INSTRUCTIONS  Always review your discharge instruction sheet given to you by the facility where your surgery was performed.  IF YOU HAVE DISABILITY OR FAMILY LEAVE FORMS, YOU MUST BRING THEM TO THE OFFICE FOR PROCESSING.  DO NOT GIVE THEM TO YOUR DOCTOR.  A prescription for pain medication may be given to you upon discharge.  Take your pain medication as prescribed, if needed.  If narcotic pain medicine is not needed, then you may take acetaminophen (Tylenol) or ibuprofen (Advil) as needed. Take your usually prescribed medications unless otherwise directed If you need a refill on your pain medication, please contact your pharmacy.  They will contact our office to request authorization.  Prescriptions will not be filled after 5pm or on week-ends. You should eat very light the first 24 hours after surgery, such as soup, crackers, pudding, etc.  Resume your normal diet the day after surgery. Most patients will experience some swelling and bruising in the breast.  Ice packs and a good support bra will help.  Swelling and bruising can take several days to resolve.  It is common to experience some constipation if taking pain medication after surgery.  Increasing fluid intake and taking a stool softener will usually help or prevent this problem from occurring.  A mild laxative (Milk of Magnesia or Miralax) should be taken according to package directions if there are no bowel movements after 48 hours. Unless discharge instructions indicate otherwise, you may remove your bandages 24-48 hours after surgery, and you may shower at that time.  You may have steri-strips (small skin tapes) in place directly over the incision.  These strips should be left on the skin for 7-10 days.  If your surgeon used skin glue on the incision, you may shower in 24 hours.  The glue will flake off over the next 2-3 weeks.  Any  sutures or staples will be removed at the office during your follow-up visit. ACTIVITIES:  You may resume regular daily activities (gradually increasing) beginning the next day.  Wearing a good support bra or sports bra minimizes pain and swelling.  You may have sexual intercourse when it is comfortable. You may drive when you no longer are taking prescription pain medication, you can comfortably wear a seatbelt, and you can safely maneuver your car and apply brakes. RETURN TO WORK:  ______________________________________________________________________________________ Bonita Quin should see your doctor in the office for a follow-up appointment approximately two weeks after your surgery.  Your doctor's nurse will typically make your follow-up appointment when she calls you with your pathology report.  Expect your pathology report 2-3 business days after your surgery.  You may call to check if you do not hear from [place] after three days. OTHER INSTRUCTIONS: OK TO SHOWER STARTING TOMORROW NO VIGOROUS ACTIVITY FOR ONE WEEK ICE PACK, TYLENOL, AND IBUPROFEN ALSO FOR PAIN _______________________________________________________________________________________________ _____________________________________________________________________________________________________________________________________ _____________________________________________________________________________________________________________________________________ _____________________________________________________________________________________________________________________________________  WHEN TO CALL YOUR DOCTOR: Fever over 101.0 Nausea and/or vomiting. Extreme swelling or bruising. Continued bleeding from incision. Increased pain, redness, or drainage from the incision.  The clinic staff is available to answer your questions during regular business hours.  Please don't hesitate to call and ask to speak to one of the nurses for clinical  concerns.  If you have a medical emergency, go to the nearest emergency room or call 911.  A surgeon from [facility] is always on call at the hospital.  For further questions, please visit  [email or web address]     Post Anesthesia Home Care Instructions  Activity: Get plenty of rest for the remainder of the day. A responsible individual must stay with you for 24 hours following the procedure.  For the next 24 hours, DO NOT: -Drive a car -Advertising copywriter -Drink alcoholic beverages -Take any medication unless instructed by your physician -Make any legal decisions or sign important papers.  Meals: Start with liquid foods such as gelatin or soup. Progress to regular foods as tolerated. Avoid greasy, spicy, heavy foods. If nausea and/or vomiting occur, drink only clear liquids until the nausea and/or vomiting subsides. Call your physician if vomiting continues.  Special Instructions/Symptoms: Your throat may feel dry or sore from the anesthesia or the breathing tube placed in your throat during surgery. If this causes discomfort, gargle with warm salt water. The discomfort should disappear within 24 hours.  If you had a scopolamine patch placed behind your ear for the management of post- operative nausea and/or vomiting:  1. The medication in the patch is effective for 72 hours, after which it should be removed.  Wrap patch in a tissue and discard in the trash. Wash hands thoroughly with soap and water. 2. You may remove the patch earlier than 72 hours if you experience unpleasant side effects which may include dry mouth, dizziness or visual disturbances. 3. Avoid touching the patch. Wash your hands with soap and water after contact with the patch.  No tylenol until after 5pm if needed today.

## 2020-12-06 NOTE — Anesthesia Preprocedure Evaluation (Signed)
Anesthesia Evaluation  Patient identified by MRN, date of birth, ID band Patient awake    Reviewed: Allergy & Precautions, NPO status , Patient's Chart, lab work & pertinent test results  Airway Mallampati: II  TM Distance: >3 FB Neck ROM: Full    Dental  (+) Teeth Intact   Pulmonary neg pulmonary ROS, former smoker,    Pulmonary exam normal        Cardiovascular negative cardio ROS   Rhythm:Regular     Neuro/Psych Anxiety MS, well controlled with 6 month infusion therapy    GI/Hepatic negative GI ROS, Neg liver ROS,   Endo/Other  negative endocrine ROS  Renal/GU negative Renal ROS  negative genitourinary   Musculoskeletal Chronic right breast mastitis    Abdominal (+)  Abdomen: soft.    Peds  Hematology negative hematology ROS (+)   Anesthesia Other Findings   Reproductive/Obstetrics                             Anesthesia Physical Anesthesia Plan  ASA: 2  Anesthesia Plan: General   Post-op Pain Management:    Induction: Intravenous  PONV Risk Score and Plan: 3 and Ondansetron, Dexamethasone and Midazolam  Airway Management Planned: Mask and LMA  Additional Equipment: None  Intra-op Plan:   Post-operative Plan: Extubation in OR  Informed Consent: I have reviewed the patients History and Physical, chart, labs and discussed the procedure including the risks, benefits and alternatives for the proposed anesthesia with the patient or authorized representative who has indicated his/her understanding and acceptance.     Dental advisory given  Plan Discussed with: CRNA  Anesthesia Plan Comments: (Lab Results      Component                Value               Date                      PREGTESTUR               NEGATIVE            12/06/2020                HCG                      <5.0                04/13/2018           Lab Results      Component                Value                Date                      WBC                      5.3                 11/30/2020                HGB                      12.8                11/30/2020  HCT                      40.4                11/30/2020                MCV                      87.3                11/30/2020                PLT                      306                 11/30/2020           Lab Results      Component                Value               Date                      NA                       137                 11/30/2020                K                        4.0                 11/30/2020                CO2                      27                  11/30/2020                GLUCOSE                  84                  11/30/2020                BUN                      10                  11/30/2020                CREATININE               0.75                11/30/2020                CALCIUM                  8.7 (L)             11/30/2020                GFRNONAA                 >  60                 11/30/2020          )        Anesthesia Quick Evaluation

## 2020-12-06 NOTE — Anesthesia Postprocedure Evaluation (Signed)
Anesthesia Post Note  Patient: EMERY BINZ  Procedure(s) Performed: RIGHT BREAST LUMPECTOMY (Right: Breast)     Patient location during evaluation: PACU Anesthesia Type: General Level of consciousness: awake and alert Pain management: pain level controlled Vital Signs Assessment: post-procedure vital signs reviewed and stable Respiratory status: spontaneous breathing, nonlabored ventilation, respiratory function stable and patient connected to nasal cannula oxygen Cardiovascular status: blood pressure returned to baseline and stable Postop Assessment: no apparent nausea or vomiting Anesthetic complications: no   No notable events documented.  Last Vitals:  Vitals:   12/06/20 1300 12/06/20 1332  BP: 95/62 (!) 128/92  Pulse: 80 71  Resp: 14 14  Temp:  36.4 C  SpO2: 94% 100%    Last Pain:  Vitals:   12/06/20 1322  TempSrc:   PainSc: 6                  Jaylen Knope P Kadrian Partch

## 2020-12-06 NOTE — Interval H&P Note (Signed)
History and Physical Interval Note: no change in H and P  12/06/2020 11:17 AM  Marissa Andersen  has presented today for surgery, with the diagnosis of CHRONIC RIGHT BREAST MASTITIS.  The various methods of treatment have been discussed with the patient and family. After consideration of risks, benefits and other options for treatment, the patient has consented to  Procedure(s): RIGHT BREAST LUMPECTOMY (Right) as a surgical intervention.  The patient's history has been reviewed, patient examined, no change in status, stable for surgery.  I have reviewed the patient's chart and labs.  Questions were answered to the patient's satisfaction.     Abigail Miyamoto

## 2020-12-06 NOTE — Transfer of Care (Signed)
Immediate Anesthesia Transfer of Care Note  Patient: Marissa Andersen  Procedure(s) Performed: RIGHT BREAST LUMPECTOMY (Right: Breast)  Patient Location: PACU  Anesthesia Type:General  Level of Consciousness: drowsy  Airway & Oxygen Therapy: Patient Spontanous Breathing and Patient connected to face mask oxygen  Post-op Assessment: Report given to RN and Post -op Vital signs reviewed and stable  Post vital signs: Reviewed and stable  Last Vitals:  Vitals Value Taken Time  BP 123/88 12/06/20 1237  Temp    Pulse 75 12/06/20 1239  Resp 17 12/06/20 1239  SpO2 100 % 12/06/20 1239  Vitals shown include unvalidated device data.  Last Pain:  Vitals:   12/06/20 1110  TempSrc: Oral  PainSc: 0-No pain         Complications: No notable events documented.

## 2020-12-06 NOTE — Anesthesia Procedure Notes (Signed)
Procedure Name: LMA Insertion Date/Time: 12/06/2020 11:44 AM Performed by: Lauralyn Primes, CRNA Pre-anesthesia Checklist: Patient identified, Emergency Drugs available, Suction available and Patient being monitored Patient Re-evaluated:Patient Re-evaluated prior to induction Oxygen Delivery Method: Circle system utilized Preoxygenation: Pre-oxygenation with 100% oxygen Induction Type: IV induction Ventilation: Mask ventilation without difficulty LMA: LMA inserted LMA Size: 4.0 Number of attempts: 1 Airway Equipment and Method: Bite block Placement Confirmation: positive ETCO2 Tube secured with: Tape Dental Injury: Teeth and Oropharynx as per pre-operative assessment

## 2020-12-06 NOTE — Op Note (Signed)
RIGHT BREAST LUMPECTOMY  Procedure Note  SHANTORIA ELLWOOD 12/06/2020   Pre-op Diagnosis: CHRONIC RIGHT BREAST MASTITIS     Post-op Diagnosis: Same  Procedure(s): RIGHT BREAST LUMPECTOMY  Surgeon(s): Abigail Miyamoto, MD  Anesthesia: General  Staff:  Circulator: Maryan Rued, RN Relief Circulator: Valda Lamb, RN Relief Scrub: Kennith Maes, RN Scrub Person: Bernadette Hoit D  Estimated Blood Loss: Minimal               Specimens: Sent to pathology  Indications: This is a 35 year old female who presented in early August with multiple hard nodules in her breast.  She had multiple areas biopsied in the breast including an axillary lymph node which all showed chronic inflammation consistent with mastitis.  No malignancy was found.  She developed a chronic open wound on the breast at the 9 o'clock position.  She also continued to have hard firm nodules that did improve after antibiotics and steroids.  Because they were still present and because of the open wound the decision was made to proceed with a right breast lumpectomy  Procedure: The patient is brought to operating identifies correct patient.  She is placed upon the operating table general anesthesia was induced.  Her right breast was prepped and draped in usual sterile fashion.  I anesthetized the skin of the lateral edge of the areola and the breast with Marcaine.  I then made an elliptical incision including the edge of the areola and the chronic open wound at the 9 o'clock position with a scalpel.  I then dissected down to the breast tissue with electrocautery.  The patient had multiple areas of firm breast tissue with dilated ducts.  I excised the skin and the chronic wound was chronic granulation tissue and 1 main area and then excised several other hard nodules in the breast tissue.  There was no purulence.  All the breast tissue and skin was sent to pathology for evaluation.  I achieved hemostasis with the cautery.   I then thoroughly irrigated the wound with saline.  I injected it further with Marcaine.  I then closed the subcutaneous tissue with interrupted 3-0 Vicryl sutures and closed the skin with a running 4-0 Monocryl.  Dermabond was then applied.  The patient tolerated the procedure well.  All the counts were correct at the end of the procedure.  The patient was extubated in the operating room and taken in a stable condition to the recovery room.          Abigail Miyamoto   Date: 12/06/2020  Time: 12:31 PM

## 2020-12-07 ENCOUNTER — Encounter (HOSPITAL_BASED_OUTPATIENT_CLINIC_OR_DEPARTMENT_OTHER): Payer: Self-pay | Admitting: Surgery

## 2020-12-11 LAB — SURGICAL PATHOLOGY

## 2020-12-25 DIAGNOSIS — R399 Unspecified symptoms and signs involving the genitourinary system: Secondary | ICD-10-CM | POA: Diagnosis not present

## 2020-12-25 DIAGNOSIS — Z23 Encounter for immunization: Secondary | ICD-10-CM | POA: Diagnosis not present

## 2021-01-25 DIAGNOSIS — N939 Abnormal uterine and vaginal bleeding, unspecified: Secondary | ICD-10-CM | POA: Diagnosis not present

## 2021-01-25 DIAGNOSIS — Z01419 Encounter for gynecological examination (general) (routine) without abnormal findings: Secondary | ICD-10-CM | POA: Diagnosis not present

## 2021-01-25 DIAGNOSIS — N889 Noninflammatory disorder of cervix uteri, unspecified: Secondary | ICD-10-CM | POA: Diagnosis not present

## 2021-02-21 DIAGNOSIS — N889 Noninflammatory disorder of cervix uteri, unspecified: Secondary | ICD-10-CM | POA: Diagnosis not present

## 2021-02-21 DIAGNOSIS — N939 Abnormal uterine and vaginal bleeding, unspecified: Secondary | ICD-10-CM | POA: Diagnosis not present

## 2021-03-26 DIAGNOSIS — N870 Mild cervical dysplasia: Secondary | ICD-10-CM | POA: Diagnosis not present

## 2021-03-26 DIAGNOSIS — Z3202 Encounter for pregnancy test, result negative: Secondary | ICD-10-CM | POA: Diagnosis not present

## 2021-04-09 ENCOUNTER — Encounter: Payer: Self-pay | Admitting: Neurology

## 2021-04-09 ENCOUNTER — Telehealth: Payer: Self-pay | Admitting: Neurology

## 2021-04-09 NOTE — Telephone Encounter (Signed)
I will have to take these appt slots off hold since patient has not reached back out.  **IF she calls back please advise pt needs an apt asap and let us know to see if we can work a time out.

## 2021-04-09 NOTE — Telephone Encounter (Signed)
Called the patient to advise that she needs an office visit in order to get authorization for her upcoming infusion.  There was no answer.  Left a message advising we need to get her in for an appointment.  I have placed 2 slots on hold with Amy this week.  22nd at 1:30pm and 23rd at 2:30 PM.  Asked the patient to call back.  I will also send a MyChart message to the patient advising of these times available

## 2021-04-10 NOTE — Telephone Encounter (Signed)
FYI pt accepted 2-23 with 2:30 check in for 3:00 appointment with Amy, NP

## 2021-04-11 ENCOUNTER — Encounter: Payer: Self-pay | Admitting: Family Medicine

## 2021-04-11 ENCOUNTER — Ambulatory Visit: Payer: BC Managed Care – PPO | Admitting: Family Medicine

## 2021-04-11 VITALS — BP 146/103 | HR 88 | Ht 62.0 in | Wt 152.0 lb

## 2021-04-11 DIAGNOSIS — F419 Anxiety disorder, unspecified: Secondary | ICD-10-CM | POA: Diagnosis not present

## 2021-04-11 DIAGNOSIS — Z79899 Other long term (current) drug therapy: Secondary | ICD-10-CM

## 2021-04-11 DIAGNOSIS — R519 Headache, unspecified: Secondary | ICD-10-CM

## 2021-04-11 DIAGNOSIS — R5383 Other fatigue: Secondary | ICD-10-CM

## 2021-04-11 DIAGNOSIS — G35 Multiple sclerosis: Secondary | ICD-10-CM

## 2021-04-11 NOTE — Patient Instructions (Signed)
Below is our plan:  We will continue current treatment plan. We will update labs, today.   Please make sure you are staying well hydrated. I recommend 50-60 ounces daily. Well balanced diet and regular exercise encouraged. Consistent sleep schedule with 6-8 hours recommended.   Please continue follow up with care team as directed.   Follow up with Dr Sater in 6 months   You may receive a survey regarding today's visit. I encourage you to leave honest feed back as I do use this information to improve patient care. Thank you for seeing me today!    

## 2021-04-11 NOTE — Progress Notes (Signed)
Chief Complaint  Marissa Andersen presents with   Follow-up    Pt with husband , rm 1. Overall stable states things are well. Received ocrevus and is due for next infusion in march.      HISTORY OF PRESENT ILLNESS:  04/11/21 ALL:  Marissa Andersen is a 36 y.o. female here today for follow up for RRMS. Marissa Andersen continues Ocrevus infusions. Last infusion 10/2020 and scheduled for next infusion 04/2021.   Marissa Andersen is doing well. No new or exacerbating symptoms. Marissa Andersen feels gait is stable. No falls. Marissa Andersen does not use assistive device. Marissa Andersen does not worsening fatigue and headaches when infusions are due. Marissa Andersen stays at home with her daughter. Mood is good on venlafaxine. Headaches well managed on verapamil. Meds managed by PCP.   Marissa Andersen was seeing [name] at [facility] but reports Marissa Andersen has moved to [place]. Marissa Andersen had an MRI brian at [facility] 03/2020 that was stable.    HISTORY (copied from Dr [name] previous note)  Marissa Andersen is a 36 y.o. woman with relapsing remitting multiple sclerosis.     Update 04/02/2020: Marissa Andersen is on Ocrevus as her disease modifying therapy.  Her next infusion will be 04/10/20.  Marissa Andersen has generally tolerated it well.     Gait is stable.   No falls.   Marissa Andersen will hold the bannister on stairs.   The left leg is numb.   Marissa Andersen has some neck pain and shoulder pain.   The left leg is mildly weak -- when weak going downstairs seems unsteady and going up is more difficult..  Marissa Andersen denies much urinary urgency at times.  Vision is stable.   Marissa Andersen has more fatigue most days.  Her sleep is still irregular. Marissa Andersen takes naps if tired.   Denies depression but some anxiety.   Cognition is the same with some reduced focus/attention.     Marissa Andersen has had some issues with headaches over the last month. Headaches are mostly left sided and occipital.  Marissa Andersen is now having them daily over the last month.  Indomethacin was helping biut then Marissa Andersen started having rebound.  Diclofenac had not helped.   Marissa Andersen had Covid last month and still feels tired and  is not sleeping as well.  Marissa Andersen has some shortness of breath, better now.  Marissa Andersen worked through her PCP and did not need to go to the ED or get antivirals or mAbs.         MS History:  Marissa Andersen was diagnosed with MS in May 2019.    In January 2019, [name] woke up with a severe headache in January that did not improve.    Then Marissa Andersen had dizziness and nausea in January and February 2019.    Her smile seemed distorted and Marissa Andersen had right facial numbness in February 2019.    Around March, Marissa Andersen noted difficulty with vision out of the right eye followed by diplopia. In [place] PCP first started to treat her for migraine.   When symptoms persisted, Marissa Andersen had a head CT which was read as normal (in retrospect, there is one focus on the left).  At some point, Marissa Andersen noted increased urinary frequency,  Marissa Andersen saw Dr. Nestor Ramp May 2019.   An MRI was ordered in late May 2019 and Marissa Andersen was diagnosed with MS based on the pattern and that multiple lesions enhanced.   Marissa Andersen did feel clumsy and fall once in June or July.    Marissa Andersen received 5 days of IV Solumedrol in late MAy,  early June.     Marissa Andersen was prescribed Ocrevus and had the split dose 08/13/2017 and 08/28/2017.       IMAGING The MRI of the brain 5/23/20219 shows multiple T2/FLAIR hyperintense foci in the hemispheres as well in the posterior pons.  About 7-8 of the lesions were enhancing after contrast.  The largest focus is in the left parietal lobe.       MRI of the cervical and thoracic spine 07/28/2017 showed foci located at C2, T1, T5 and T11.  None of them and enhanced.   Marissa Andersen also had lab work in May and June 2019.  The JCV antibody was positive at 1.56.  The neuromyelitis optica antibody was negative.   No FH of MS   REVIEW OF SYSTEMS: Out of a complete 14 system review of symptoms, the Marissa Andersen complains only of the following symptoms, fatigue, headaches and all other reviewed systems are negative.   ALLERGIES: Allergies  Allergen Reactions   Adderall [Amphetamine-Dextroamphetamine]      Pt feels it caused anxiety attack     HOME MEDICATIONS: Outpatient Medications Prior to Visit  Medication Sig Dispense Refill   Clindamycin-Benzoyl Per, Refr, gel APPLY TO FACE EACH MORNING.     diazepam (VALIUM) 5 MG tablet Take 5 mg by mouth 2 (two) times daily as needed.     indomethacin (INDOCIN) 25 MG capsule Take 25 mg by mouth daily as needed for headache.     ocrelizumab (OCREVUS) 300 MG/10ML injection Inject into the vein once.     ondansetron (ZOFRAN) 4 MG tablet Take 1 tablet (4 mg total) by mouth every 8 (eight) hours as needed for nausea or vomiting. 20 tablet 0   spironolactone (ALDACTONE) 100 MG tablet Take 100 mg by mouth daily.     venlafaxine XR (EFFEXOR-XR) 75 MG 24 hr capsule Take 75 mg by mouth daily.     verapamil (CALAN-SR) 120 MG CR tablet Take 120 mg by mouth daily.     VITAMIN D PO Take 5,000 Units by mouth daily.     Cholecalciferol (VITAMIN D-3) 125 MCG (5000 UT) TABS Take 5,000 Units by mouth daily.     eletriptan (RELPAX) 40 MG tablet Take 49 mg by mouth as needed.     indomethacin (INDOCIN) 25 MG capsule TAKE ONCE OR TWICE A DAY WITH FOOD AS NEEDED FOR MIGRAINE 60 capsule 1   OCRELIZUMAB IV Inject into the vein every 6 (six) months. ocrevus     Omega-3 Fatty Acids (FISH OIL PO) Take by mouth.     oxyCODONE (OXY IR/ROXICODONE) 5 MG immediate release tablet Take 1 tablet (5 mg total) by mouth every 6 (six) hours as needed for moderate pain, severe pain or breakthrough pain. 20 tablet 0   spironolactone (ALDACTONE) 100 MG tablet Take one tablet by mouth daily     venlafaxine (EFFEXOR) 75 MG tablet Take 75 mg by mouth 2 (two) times daily.     No facility-administered medications prior to visit.     PAST MEDICAL HISTORY: Past Medical History:  Diagnosis Date   Headache    Multiple sclerosis ([place])    Vision abnormalities      PAST SURGICAL HISTORY: Past Surgical History:  Procedure Laterality Date   BREAST LUMPECTOMY Right 12/06/2020   Procedure:  RIGHT BREAST LUMPECTOMY;  Surgeon: [name], MD;  Location: [place];  Service: General;  Laterality: Right;   CERVICAL CONE BIOPSY       FAMILY HISTORY: Family History  Problem Relation Age of Onset   Hypertension Mother    Diabetes type II Mother    Multiple myeloma Mother    Hypertension Father    Healthy Sister    Healthy Brother    Diabetes Maternal Grandmother    Diabetes Maternal Grandfather    Hypertension Paternal Grandmother    Dementia Paternal Grandmother      SOCIAL HISTORY: Social History   Socioeconomic History   Marital status: Married    Spouse name: Marissa Andersen   Number of children: 2   Years of education: Not on file   Highest education level: Not on file  Occupational History   Not on file  Tobacco Use   Smoking status: Former   Smokeless tobacco: Never  Scientific laboratory technician Use: Never used  Substance and Sexual Activity   Alcohol use: Not Currently   Drug use: Never   Sexual activity: Not on file  Other Topics Concern   Not on file  Social History Narrative   ** Merged History Encounter **       Social Determinants of Health   Financial Resource Strain: Not on file  Food Insecurity: Not on file  Transportation Needs: Not on file  Physical Activity: Not on file  Stress: Not on file  Social Connections: Not on file  Intimate Partner Violence: Not on file     PHYSICAL EXAM  Vitals:   04/11/21 1450  BP: (!) 146/103  Pulse: 88  Weight: 152 lb (68.9 kg)  Height: '5\' 2"'  (1.575 m)   Body mass index is 27.8 kg/m.  Generalized: Well developed, in no acute distress  Cardiology: normal rate and rhythm, no murmur auscultated  Respiratory: clear to auscultation bilaterally    Neurological examination  Mentation: Alert oriented to time, place, history taking. Follows all commands speech and language fluent Cranial nerve II-XII: Pupils were equal round reactive to light. Extraocular movements were full, visual field  were full on confrontational test. Facial sensation and strength were normal. Head turning and shoulder shrug  were normal and symmetric. Motor: The motor testing reveals 5 over 5 strength of all 4 extremities. Good symmetric motor tone is noted throughout.  Sensory: Sensory testing is intact to soft touch on all 4 extremities. No evidence of extinction is noted.  Coordination: Cerebellar testing reveals good finger-nose-finger and heel-to-shin bilaterally.  Gait and station: Gait is normal.  Reflexes: Deep tendon reflexes are symmetric and normal bilaterally.    DIAGNOSTIC DATA (LABS, IMAGING, TESTING) - I reviewed Marissa Andersen records, labs, notes, testing and imaging myself where available.  Lab Results  Component Value Date   WBC 5.3 11/30/2020   HGB 12.8 11/30/2020   HCT 40.4 11/30/2020   MCV 87.3 11/30/2020   PLT 306 11/30/2020      Component Value Date/Time   NA 137 11/30/2020 0952   K 4.0 11/30/2020 0952   CL 104 11/30/2020 0952   CO2 27 11/30/2020 0952   GLUCOSE 84 11/30/2020 0952   BUN 10 11/30/2020 0952   CREATININE 0.75 11/30/2020 0952   CALCIUM 8.7 (L) 11/30/2020 0952   GFRNONAA >60 11/30/2020 0952   GFRAA >60 04/13/2018 2048   No results found for: CHOL, HDL, LDLCALC, LDLDIRECT, TRIG, CHOLHDL No results found for: HGBA1C No results found for: VITAMINB12 No results found for: TSH  No flowsheet data found.   No flowsheet data found.   ASSESSMENT AND PLAN  36 y.o. year old female  has a past medical history of Headache,  Multiple sclerosis ([place]), and Vision abnormalities. here with    Relapsing remitting multiple sclerosis ([place]) - Plan: IgG, IgA, IgM, CBC with Differential/Platelets  High risk medication use  Other fatigue  Anxiety  Nonintractable episodic headache, unspecified headache type  Eleah is doing well, today. We will continue Ocrevus infusions every 6 months. I will update labs, today. MRI stable 03/2020. Marissa Andersen will continue healthy lifestyle  habits and regular follow up with PCP. Marissa Andersen will return to see Dr Felecia Shelling in 6 months.    Orders Placed This Encounter  Procedures   IgG, IgA, IgM   CBC with Differential/Platelets     No orders of the defined types were placed in this encounter.     Debbora Presto, MSN, FNP-C 04/11/2021, 3:33 PM  [facility] [address] [place], [place] [address] [phone]

## 2021-04-12 LAB — CBC WITH DIFFERENTIAL/PLATELET
Basophils Absolute: 0 10*3/uL (ref 0.0–0.2)
Basos: 0 %
EOS (ABSOLUTE): 0.2 10*3/uL (ref 0.0–0.4)
Eos: 3 %
Hematocrit: 39.6 % (ref 34.0–46.6)
Hemoglobin: 12.8 g/dL (ref 11.1–15.9)
Immature Grans (Abs): 0 10*3/uL (ref 0.0–0.1)
Immature Granulocytes: 0 %
Lymphocytes Absolute: 1.5 10*3/uL (ref 0.7–3.1)
Lymphs: 21 %
MCH: 26.9 pg (ref 26.6–33.0)
MCHC: 32.3 g/dL (ref 31.5–35.7)
MCV: 83 fL (ref 79–97)
Monocytes Absolute: 0.6 10*3/uL (ref 0.1–0.9)
Monocytes: 8 %
Neutrophils Absolute: 4.6 10*3/uL (ref 1.4–7.0)
Neutrophils: 68 %
Platelets: 345 10*3/uL (ref 150–450)
RBC: 4.76 x10E6/uL (ref 3.77–5.28)
RDW: 12.2 % (ref 11.7–15.4)
WBC: 6.9 10*3/uL (ref 3.4–10.8)

## 2021-04-12 LAB — IGG, IGA, IGM
IgA/Immunoglobulin A, Serum: 106 mg/dL (ref 87–352)
IgG (Immunoglobin G), Serum: 1236 mg/dL (ref 586–1602)
IgM (Immunoglobulin M), Srm: 31 mg/dL (ref 26–217)

## 2021-04-19 DIAGNOSIS — F419 Anxiety disorder, unspecified: Secondary | ICD-10-CM | POA: Diagnosis not present

## 2021-04-19 DIAGNOSIS — I10 Essential (primary) hypertension: Secondary | ICD-10-CM | POA: Diagnosis not present

## 2021-05-07 DIAGNOSIS — G35 Multiple sclerosis: Secondary | ICD-10-CM | POA: Diagnosis not present

## 2021-05-21 ENCOUNTER — Telehealth: Payer: Self-pay | Admitting: Family Medicine

## 2021-05-21 NOTE — Telephone Encounter (Signed)
Pt states for about a month she has been having muscle stiffness all over, pt is asking for a call to discuss.  ?

## 2021-05-21 NOTE — Telephone Encounter (Signed)
Called the pt back and she states over the last couple weeks she has been waking up feeling sore and stiff. It was initially occurring in the morning after waking up and she thought that mattress needing turning around. They did that and she states that it continues to occur. She has noticed that it is starting to become more noticiable throughout the day. She feels like everything is tight and she has to stretch. Her hands are even stiff. Pt states can still move everything. She notes that in lower spine she is also having some pain and discomfort and has a numbness or decreased sensation described in the left leg as well.  ?I saw where the pt had diazepam on her medication list that can be used as needed. I asked if she still has this and she states that she does. Encouraged the pt to try this and see I that helps relax her muscles and if that tightness eases up. Encouraged to also alternate hot and cold compress on the lower lumbar area. Advised for her to try this and see if she notes relief.  ?Advised I would also pass this along to Dr Felecia Shelling to see if he agrees with that plan and/or has alternative recommendation. Pt verbalized understanding. ? ?

## 2021-06-23 DIAGNOSIS — M47814 Spondylosis without myelopathy or radiculopathy, thoracic region: Secondary | ICD-10-CM | POA: Diagnosis not present

## 2021-06-23 DIAGNOSIS — G35 Multiple sclerosis: Secondary | ICD-10-CM | POA: Diagnosis not present

## 2021-06-23 DIAGNOSIS — R9082 White matter disease, unspecified: Secondary | ICD-10-CM | POA: Diagnosis not present

## 2021-08-01 DIAGNOSIS — R110 Nausea: Secondary | ICD-10-CM | POA: Diagnosis not present

## 2021-08-01 DIAGNOSIS — R109 Unspecified abdominal pain: Secondary | ICD-10-CM | POA: Diagnosis not present

## 2021-08-01 DIAGNOSIS — R195 Other fecal abnormalities: Secondary | ICD-10-CM | POA: Diagnosis not present

## 2021-09-12 ENCOUNTER — Other Ambulatory Visit: Payer: Self-pay | Admitting: Physician Assistant

## 2021-09-12 ENCOUNTER — Ambulatory Visit
Admission: RE | Admit: 2021-09-12 | Discharge: 2021-09-12 | Disposition: A | Discharge status: Home / Self Care | Payer: BC Managed Care – PPO | Source: Ambulatory Visit | Attending: Physician Assistant | Admitting: Physician Assistant

## 2021-09-12 DIAGNOSIS — G35 Multiple sclerosis: Secondary | ICD-10-CM | POA: Diagnosis not present

## 2021-09-12 DIAGNOSIS — R059 Cough, unspecified: Secondary | ICD-10-CM

## 2021-09-12 DIAGNOSIS — R0602 Shortness of breath: Secondary | ICD-10-CM | POA: Diagnosis not present

## 2021-10-09 ENCOUNTER — Ambulatory Visit: Payer: BC Managed Care – PPO | Admitting: Neurology

## 2021-10-09 ENCOUNTER — Encounter: Payer: Self-pay | Admitting: Neurology

## 2021-10-09 VITALS — BP 134/87 | HR 70 | Ht 62.0 in | Wt 152.0 lb

## 2021-10-09 DIAGNOSIS — Z79899 Other long term (current) drug therapy: Secondary | ICD-10-CM

## 2021-10-09 DIAGNOSIS — G35 Multiple sclerosis: Secondary | ICD-10-CM | POA: Diagnosis not present

## 2021-10-09 DIAGNOSIS — R269 Unspecified abnormalities of gait and mobility: Secondary | ICD-10-CM | POA: Diagnosis not present

## 2021-10-09 DIAGNOSIS — F419 Anxiety disorder, unspecified: Secondary | ICD-10-CM

## 2021-10-09 DIAGNOSIS — F09 Unspecified mental disorder due to known physiological condition: Secondary | ICD-10-CM

## 2021-10-09 DIAGNOSIS — R7989 Other specified abnormal findings of blood chemistry: Secondary | ICD-10-CM

## 2021-10-09 NOTE — Progress Notes (Signed)
[facility]  PATIENT: Marissa Andersen DOB: March 07, 1985  REFERRING DOCTOR OR PCP:  Dr. Barrie Folk SOURCE: Patient, notes from Dr. Nestor Ramp, imaging and lab reports, MRI images personally reviewed on PACS.  _________________________________   HISTORICAL  CHIEF COMPLAINT:  Chief Complaint  Patient presents with   Follow-up    Rm 1, alone. Here for 6 month MS f/u, on Ocrevus and tolerating well. Last infusion date: 05/07/2021 and Next infusion date: 11/05/2021. Has been having off/on spacticity.     HISTORY OF PRESENT ILLNESS:  Marissa Andersen is a 36 y.o. woman with relapsing remitting multiple sclerosis.    Update 10/09/2021: She is on Ocrevus as her disease modifying therapy since diagnosis in June 2019.  Her next infusion will be 04/10/20.  She has generally tolerated it well.    Gait is stable with no falls or major problem with balance   She will hold the bannister on stairs going down.  She has sometimes had right hip pain. The left leg is numb.   She has some neck pain and shoulder pain.   The left leg is mildly weak -- when weak going downstairs seems unsteady and going up is more difficult.. She notes mild spasticity in legs - better with stretching and worse after laying down all night.  She has some urinary urgency at times abd no incntinence.  Vision is stable.  She has fatigue 90 days and sometimes has difficulty with apathy..   Denies depression but some anxiety.   Cognition is the same with some reduced focus/attention.  She notes some verbal fluency issues.    We had tried Adderall XR 15 mg but she felt more anxious with it and stopped after only a few pills  Her headaches are doing better than last year.  She waa on Vit D 5000 U daily and we discussed restarting.   MS History:  She was diagnosed with MS in May 2019.    In January 2019, [name] woke up with a severe headache in January that did not improve.    Then she had dizziness and nausea in January  and February 2019.    Her smile seemed distorted and she had right facial numbness in February 2019.    Around March, she noted difficulty with vision out of the right eye followed by diplopia. In [place] PCP first started to treat her for migraine.   When symptoms persisted, she had a head CT which was read as normal (in retrospect, there is one focus on the left).  At some point, she noted increased urinary frequency,  She saw Dr. Nestor Ramp May 2019.   An MRI was ordered in late May 2019 and she was diagnosed with MS based on the pattern and that multiple lesions enhanced.   She did feel clumsy and fall once in June or July.    She received 5 days of IV Solumedrol in late MAy, early June.     She was prescribed Ocrevus and had the split dose 08/13/2017 and 08/28/2017.       IMAGING The MRI of the brain 5/23/20219 shows multiple T2/FLAIR hyperintense foci in the hemispheres as well in the posterior pons.  About 7-8 of the lesions were enhancing after contrast.  The largest focus is in the left parietal lobe.      MRI of the cervical and thoracic spine 07/28/2017 showed foci located at C2, T1, T5 and T11.  None of them and enhanced.   She  also had lab work in May and June 2019.  The JCV antibody was positive at 1.56.  The neuromyelitis optica antibody was negative.  MRI of the brain 09/01/2018 showed  Multiple T2/flair hyperintense foci in the hemispheres and the right cerebral peduncle in a pattern and configuration consistent with chronic demyelinating plaque associated with multiple sclerosis.  None of the foci enhances on the current study.  Compared to the 07/09/2017 MRI, there are no new lesions. No FH of MS  REVIEW OF SYSTEMS: Constitutional: No fevers, chills, sweats, or change in appetite.   She has some fatigue. Eyes: No visual changes, double vision, eye pain Ear, nose and throat: No hearing loss, ear pain, nasal congestion, sore throat Cardiovascular: No chest pain, palpitations Respiratory:   No shortness of breath at rest or with exertion.   No wheezes GastrointestinaI: No nausea, vomiting, diarrhea, abdominal pain, fecal incontinence Genitourinary: She has urinary urgency and frequency.  No incontinence.   Musculoskeletal:  No neck pain, back pain Integumentary: No rash, pruritus, skin lesions Neurological: as above Psychiatric: No depression at this time but has felt a little sad since her diagnosis..  No anxiety Endocrine: No palpitations, diaphoresis, change in appetite, change in weigh or increased thirst Hematologic/Lymphatic:  No anemia, purpura, petechiae. Allergic/Immunologic: No itchy/runny eyes, nasal congestion, recent allergic reactions, rashes  ALLERGIES: Allergies  Allergen Reactions   Adderall [Amphetamine-Dextroamphetamine]     Pt feels it caused anxiety attack    HOME MEDICATIONS:  Current Outpatient Medications:    diazepam (VALIUM) 5 MG tablet, Take 5 mg by mouth 2 (two) times daily as needed., Disp: , Rfl:    indomethacin (INDOCIN) 25 MG capsule, Take 25 mg by mouth daily as needed for headache., Disp: , Rfl:    ocrelizumab (OCREVUS) 300 MG/10ML injection, Inject into the vein once., Disp: , Rfl:    venlafaxine XR (EFFEXOR-XR) 75 MG 24 hr capsule, Take 75 mg by mouth daily., Disp: , Rfl:   PAST MEDICAL HISTORY: Past Medical History:  Diagnosis Date   Headache    Multiple sclerosis ([place])    Vision abnormalities     PAST SURGICAL HISTORY: Past Surgical History:  Procedure Laterality Date   BREAST LUMPECTOMY Right 12/06/2020   Procedure: RIGHT BREAST LUMPECTOMY;  Surgeon: [name], MD;  Location: [place];  Service: General;  Laterality: Right;   CERVICAL CONE BIOPSY      FAMILY HISTORY: Family History  Problem Relation Age of Onset   Hypertension Mother    Diabetes type II Mother    Multiple myeloma Mother    Hypertension Father    Healthy Sister    Healthy Brother    Diabetes Maternal Grandmother     Diabetes Maternal Grandfather    Hypertension Paternal Grandmother    Dementia Paternal Grandmother     SOCIAL HISTORY:  Social History   Socioeconomic History   Marital status: Married    Spouse name: Delrae Alfred   Number of children: 2   Years of education: Not on file   Highest education level: Not on file  Occupational History   Not on file  Tobacco Use   Smoking status: Former   Smokeless tobacco: Never  Scientific laboratory technician Use: Never used  Substance and Sexual Activity   Alcohol use: Not Currently   Drug use: Never   Sexual activity: Not on file  Other Topics Concern   Not on file  Social History Narrative   ** Merged History Encounter **  Social Determinants of Health   Financial Resource Strain: Not on file  Food Insecurity: Not on file  Transportation Needs: Not on file  Physical Activity: Not on file  Stress: Not on file  Social Connections: Not on file  Intimate Partner Violence: Not on file     PHYSICAL EXAM  Vitals:   10/09/21 1004  BP: 134/87  Pulse: 70  Weight: 152 lb (68.9 kg)  Height: '5\' 2"'  (1.575 m)    Body mass index is 27.8 kg/m.   General: The patient is well-developed and well-nourished and in no acute distress  Skin: Extremities are without rash or edema.  Musculoskeletal:  Back is nontender.  Neck has good ROM but is tender on the left over splenius capitus muscle.   Neurologic Exam  Mental status: The patient is alert and oriented x 3 at the time of the examination. The patient has apparent normal recent and remote memory, with an apparently normal attention span and concentration ability.   Speech is normal.  Cranial nerves: Extraocular movements are full.  .  Facial symmetry is present. There is good facial sensation to soft touch bilaterally.Facial strength is normal.  Trapezius and sternocleidomastoid strength is normal. No dysarthria is noted.    No obvious hearing deficits are noted.  Motor:  Muscle bulk is normal.    Tone is normal. Strength is  5 / 5 in all 4 extremities.   Sensory: Sensory testing is intact to pinprick, soft touch and vibration sensation in arms but reduced vibration and touch in left leg relative to the right  Coordination: Cerebellar testing reveals good finger-nose-finger and heel-to-shin bilaterally.  Gait and station: Station is normal.   The gait is normal.  Tandem gait is mildly wide..  Romberg is negative.   Reflexes: Deep tendon reflexes are symmetric and normal bilaterally.     DIAGNOSTIC DATA (LABS, IMAGING, TESTING) - I reviewed patient records, labs, notes, testing and imaging myself where available.  Lab Results  Component Value Date   WBC 6.9 04/11/2021   HGB 12.8 04/11/2021   HCT 39.6 04/11/2021   MCV 83 04/11/2021   PLT 345 04/11/2021      ASSESSMENT AND PLAN    1. Multiple sclerosis ([place])   2. 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 High risk medication use   4. Gait disturbance   5. Anxiety   6. Low vitamin D level       1.   Continue Ocrevus.  Check labs.   We will check an MRI of the brain and cervical spine to determine if there is any subclinical progression and consider a different disease modifying therapy if this is occurring.  Labs fine last visit and wlll recheck next visit.  2.   We discussed that if attention issues worsen we could try a lower dose of Adderall or Ritalin (i.e. Adderall XR 10 mg daily) 3.   Continue vit D and supplement  5.    She will return in 6 months for regular visit.     Apoorva Bugay A. Felecia Shelling, MD, PhD, FAAN Certified in Neurology, Clinical Neurophysiology, Sleep Medicine, Pain Medicine and Neuroimaging Director, [place] [facility] [address], [address] ounces Matylda Fehring OCT different device and that was for the if there is it may just be the visit first you put in something that is respiratory like it.  I will see  if maybe a research can  see her you just just just less than an okay yeah that would be okay this is the okay great thank you [place], [place] [phone]

## 2021-10-25 DIAGNOSIS — F411 Generalized anxiety disorder: Secondary | ICD-10-CM | POA: Diagnosis not present

## 2021-11-01 DIAGNOSIS — F411 Generalized anxiety disorder: Secondary | ICD-10-CM | POA: Diagnosis not present

## 2021-11-06 DIAGNOSIS — F411 Generalized anxiety disorder: Secondary | ICD-10-CM | POA: Diagnosis not present

## 2021-11-12 DIAGNOSIS — G35 Multiple sclerosis: Secondary | ICD-10-CM | POA: Diagnosis not present

## 2021-11-15 ENCOUNTER — Telehealth: Payer: Self-pay | Admitting: Neurology

## 2021-11-15 MED ORDER — INDOMETHACIN 25 MG PO CAPS: 25.0000 mg | ORAL_CAPSULE | Freq: Every day | ORAL | 2 refills | Status: DC | PRN

## 2021-11-15 NOTE — Telephone Encounter (Signed)
Pt has called asking that Dr Felecia Shelling refill's her indomethacin (INDOCIN) 25 MG capsule.  Pt was told  Horton, Darden Dates, CPhT is the prescriber who shows for that medication.  Pt states her bottle says R Sater is on the bottle.  Pt is asking for a call on this request (she was made aware the office is no open on Fridays).

## 2021-11-15 NOTE — Telephone Encounter (Signed)
Called the patient back. This has been prescribed by Dr Felecia Shelling. Someone at some point had took it off and another person added to her medication list. Dr Felecia Shelling was prescribing this for her. The patient states the last time it was filled was July 2022. She states more recently she has developed headaches that are becoming more frequent and she wanted to have a updated script to be able to take this if needed. I have sent a refill for the pt to the pharamacy on file. Pt verbalized understanding. Pt had no questions at this time but was encouraged to call back if questions arise.

## 2021-11-22 DIAGNOSIS — F411 Generalized anxiety disorder: Secondary | ICD-10-CM | POA: Diagnosis not present

## 2021-11-25 DIAGNOSIS — F411 Generalized anxiety disorder: Secondary | ICD-10-CM | POA: Diagnosis not present

## 2021-12-06 DIAGNOSIS — F411 Generalized anxiety disorder: Secondary | ICD-10-CM | POA: Diagnosis not present

## 2021-12-24 DIAGNOSIS — F411 Generalized anxiety disorder: Secondary | ICD-10-CM | POA: Diagnosis not present

## 2022-01-03 DIAGNOSIS — F411 Generalized anxiety disorder: Secondary | ICD-10-CM | POA: Diagnosis not present

## 2022-01-16 DIAGNOSIS — F411 Generalized anxiety disorder: Secondary | ICD-10-CM | POA: Diagnosis not present

## 2022-01-20 DIAGNOSIS — F411 Generalized anxiety disorder: Secondary | ICD-10-CM | POA: Diagnosis not present

## 2022-01-30 DIAGNOSIS — F411 Generalized anxiety disorder: Secondary | ICD-10-CM | POA: Diagnosis not present

## 2022-01-31 ENCOUNTER — Other Ambulatory Visit (HOSPITAL_COMMUNITY)
Admission: RE | Admit: 2022-01-31 | Discharge: 2022-01-31 | Disposition: A | Discharge status: Home / Self Care | Payer: BC Managed Care – PPO | Source: Ambulatory Visit | Attending: Obstetrics and Gynecology | Admitting: Obstetrics and Gynecology

## 2022-01-31 DIAGNOSIS — Z01419 Encounter for gynecological examination (general) (routine) without abnormal findings: Secondary | ICD-10-CM | POA: Diagnosis not present

## 2022-02-04 LAB — CYTOLOGY - PAP
Comment: NEGATIVE
Diagnosis: NEGATIVE
High risk HPV: NEGATIVE

## 2022-02-10 IMAGING — MG MM BREAST LOCALIZATION CLIP
8 series · 8 of 24 positions shown · non-contrast
Comparison: Previous exam(s).

CLINICAL DATA: Status post ultrasound-guided core biopsies of the
right breast.

EXAM:
3D DIAGNOSTIC RIGHT MAMMOGRAM POST ULTRASOUND BIOPSIES

[R CC synth-2D]
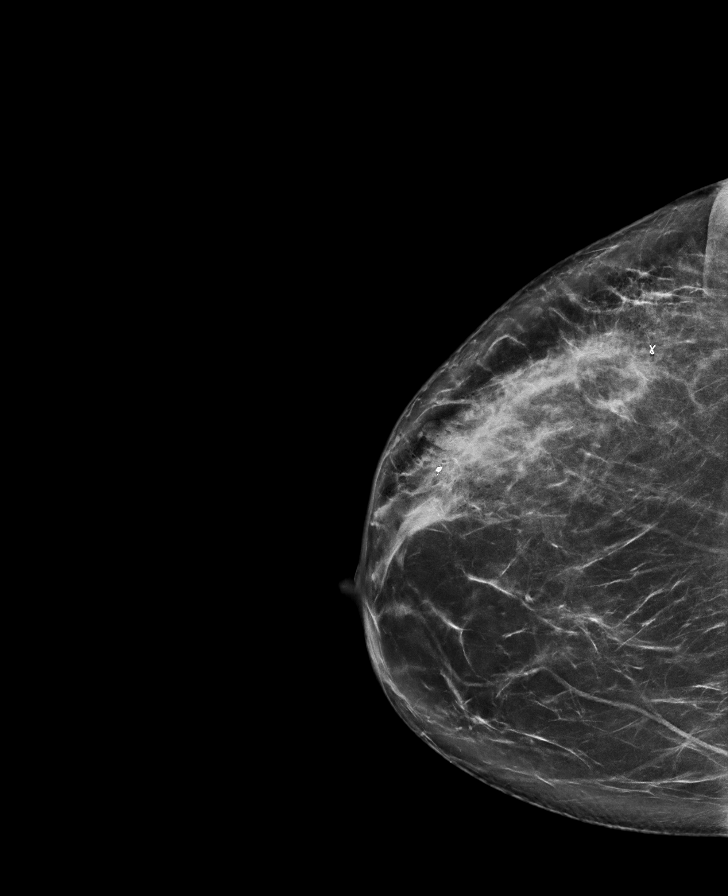

[R MLO synth-2D]
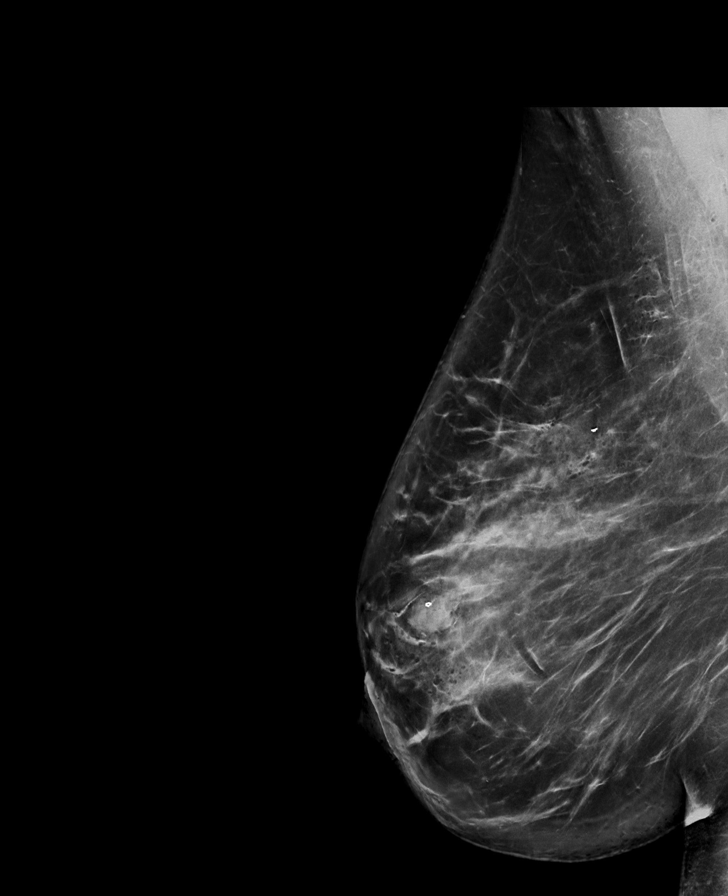

[R ML synth-2D (1 of 2)]
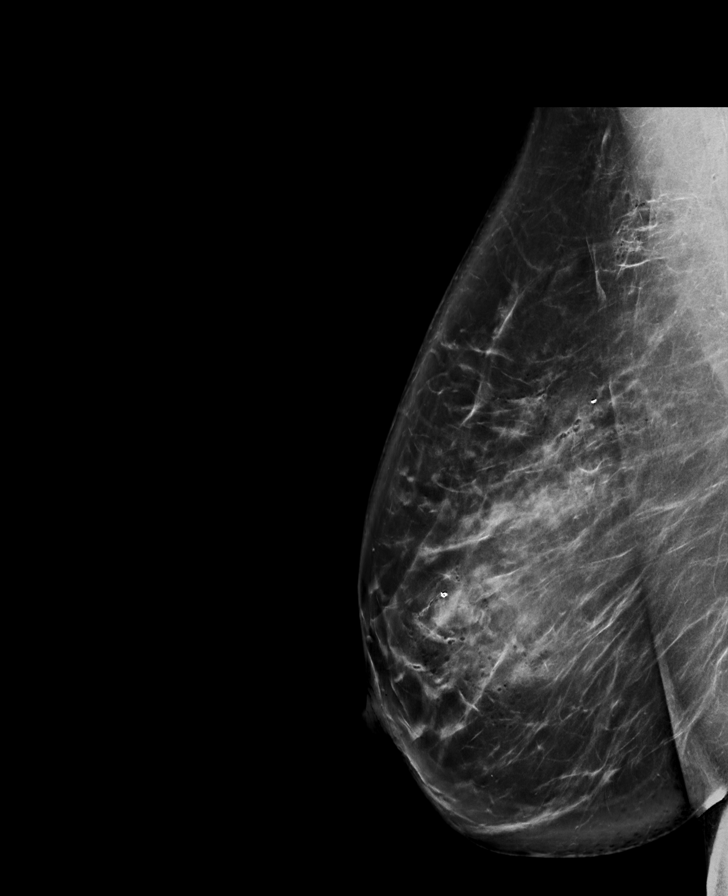

[R ML synth-2D (2 of 2)]
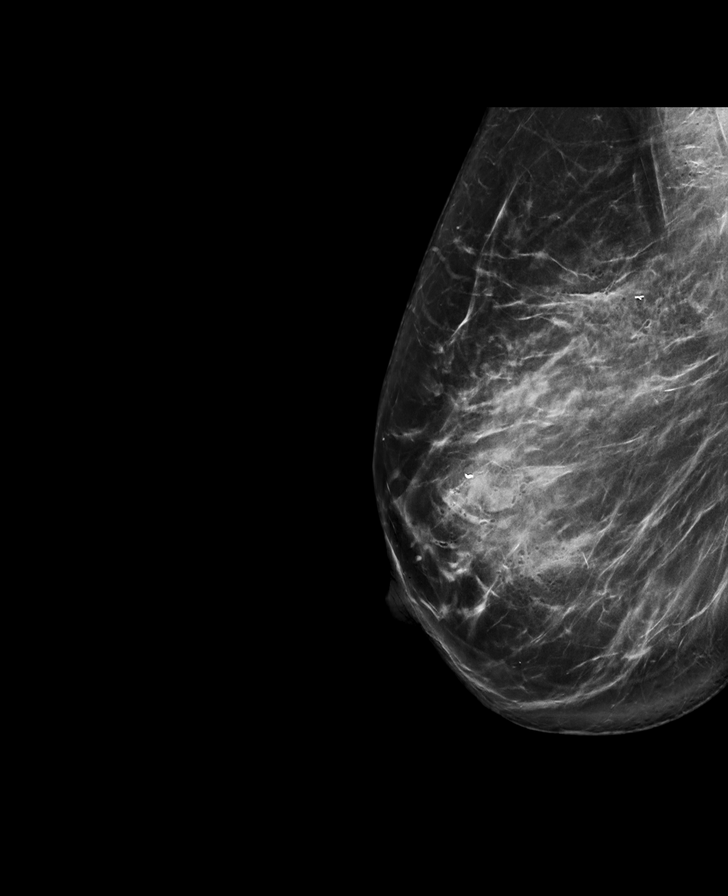

[R CC tomo · tomo slice 47/92.0]
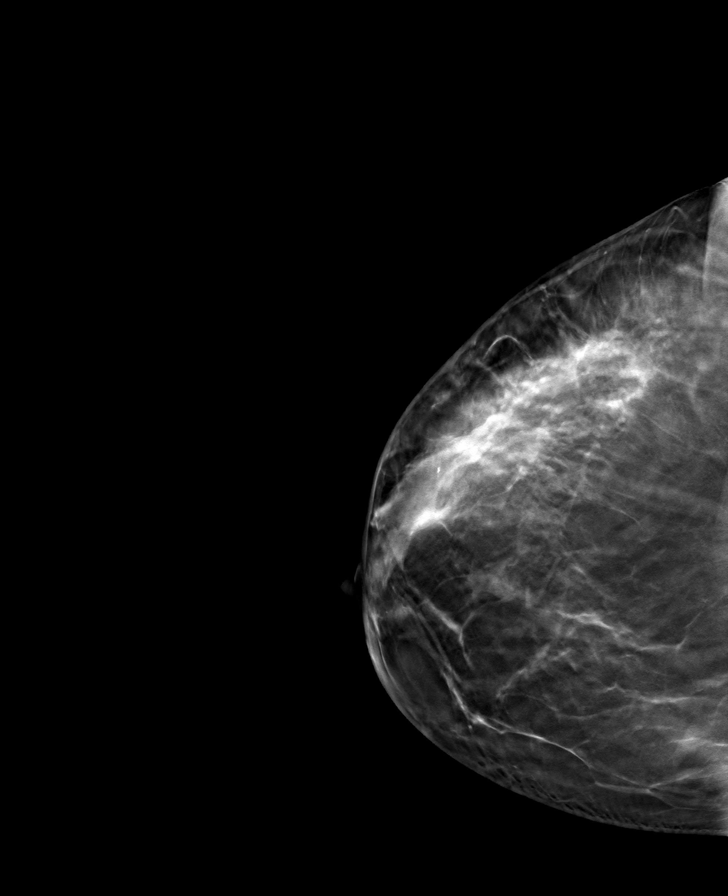

[R ML tomo (1 of 2) · tomo slice 53/104.0]
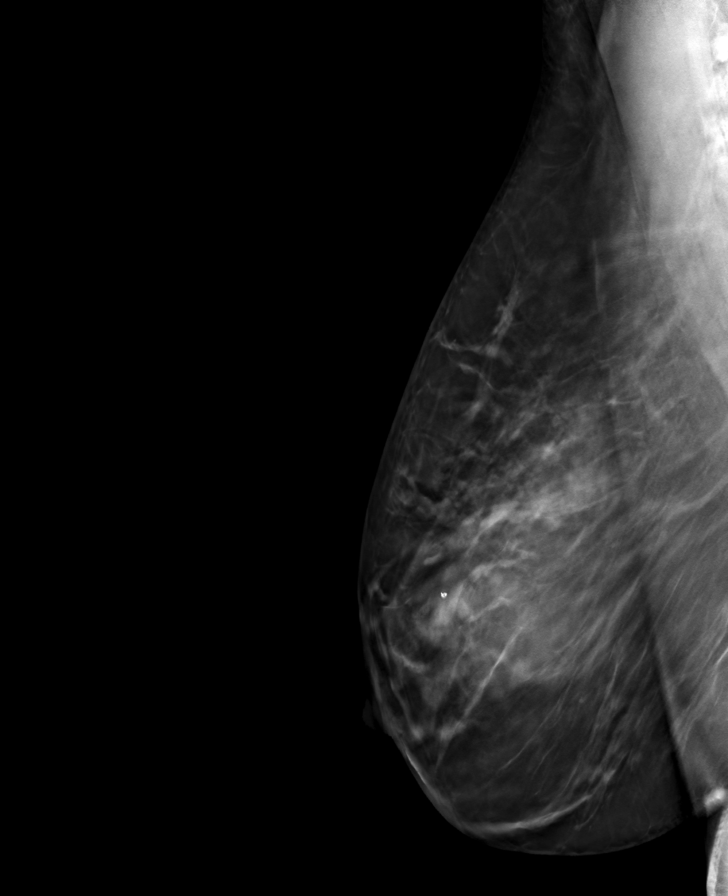

[R MLO tomo · tomo slice 51/101.0]
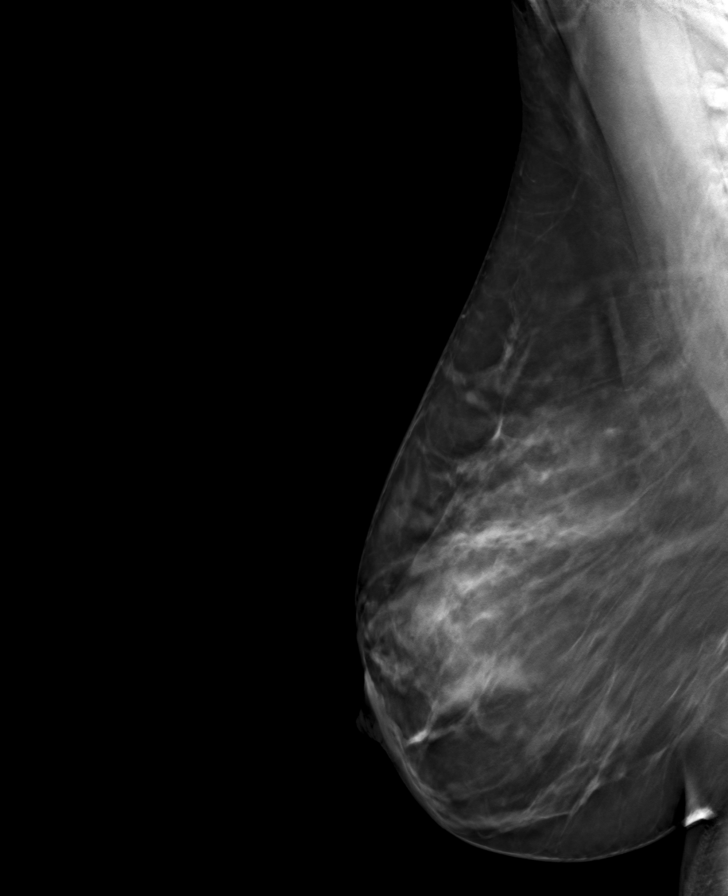

[R ML tomo (2 of 2) · tomo slice 45/89.0]
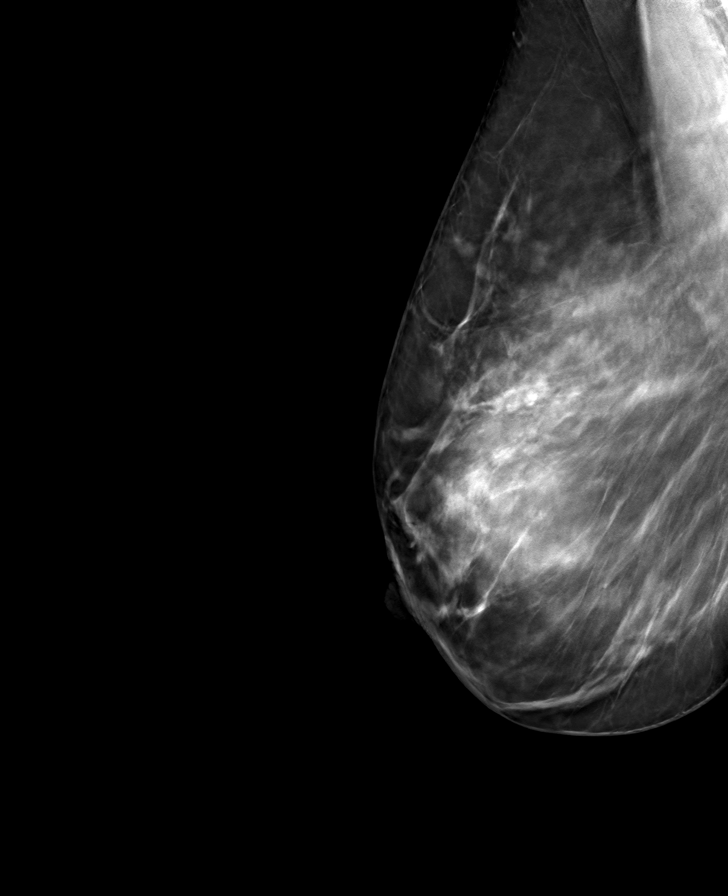

[8 of 24 positions shown; findings below may reference images not displayed]

FINDINGS: 3D Mammographic images were obtained following ultrasound guided
biopsies of the right breast and right axilla. There is a ribbon
shaped clip (ultrasound biopsy labeled as 9 o'clock 6 cm from the
nipple) in appropriate position in the upper-outer quadrant of the
right breast and a coil shaped clip (ultrasound guided biopsy
labeled 9 o'clock 2 cm from the nipple) in the upper-outer quadrant
of the right breast. The tribell shaped clip in the right axilla is
not visualized mammographically.
IMPRESSION: Appropriate positioning of the ribbon and coil shaped clips in the
upper-outer quadrant of the right breast. The clips are located
cm apart. The tribell clip in the right axilla is not imaged
mammographically.

Final Assessment: Post Procedure Mammograms for Marker Placement

## 2022-02-13 DIAGNOSIS — F411 Generalized anxiety disorder: Secondary | ICD-10-CM | POA: Diagnosis not present

## 2022-02-21 DIAGNOSIS — F411 Generalized anxiety disorder: Secondary | ICD-10-CM | POA: Diagnosis not present

## 2022-02-21 DIAGNOSIS — R309 Painful micturition, unspecified: Secondary | ICD-10-CM | POA: Diagnosis not present

## 2022-02-28 DIAGNOSIS — F411 Generalized anxiety disorder: Secondary | ICD-10-CM | POA: Diagnosis not present

## 2022-03-18 DIAGNOSIS — F411 Generalized anxiety disorder: Secondary | ICD-10-CM | POA: Diagnosis not present

## 2022-03-28 DIAGNOSIS — F411 Generalized anxiety disorder: Secondary | ICD-10-CM | POA: Diagnosis not present

## 2022-04-11 DIAGNOSIS — F411 Generalized anxiety disorder: Secondary | ICD-10-CM | POA: Diagnosis not present

## 2022-04-16 NOTE — Patient Instructions (Signed)
Below is our plan:  We will continue Ocrevus infusions every 6 months. Continue indomethacin as needed.   Please make sure you are staying well hydrated. I recommend 50-60 ounces daily. Well balanced diet and regular exercise encouraged. Consistent sleep schedule with 6-8 hours recommended.   Please continue follow up with care team as directed.   Follow up with me in 6 months   You may receive a survey regarding today's visit. I encourage you to leave honest feed back as I do use this information to improve patient care. Thank you for seeing me today!

## 2022-04-16 NOTE — Progress Notes (Unsigned)
No chief complaint on file.    HISTORY OF PRESENT ILLNESS:  04/16/22 ALL:  Marissa Andersen is a 37 y.o. female here today for follow up for RRMS. She continues Ocrevus infusions. Last infusion 10/2020 and scheduled for next infusion 04/2021. Last imaging 08/2018 stable. Repeast?  She is doing well. No new or exacerbating symptoms. She feels gait is stable. No falls. She does not use assistive device. She does not worsening fatigue and headaches when infusions are due. She stays at home with her daughter. Mood is good on venlafaxine. Headaches well managed on verapamil. Meds managed by PCP.   She was seeing [name] at [facility] but reports she has moved to [place]. She had an MRI brian at [facility] 03/2020 that was stable.    HISTORY (copied from Dr [name] previous note)  Marissa Andersen is a 37 y.o. woman with relapsing remitting multiple sclerosis.     Update 10/09/2021: She is on Ocrevus as her disease modifying therapy since diagnosis in June 2019.  Her next infusion will be 04/10/20.  She has generally tolerated it well.     Gait is stable with no falls or major problem with balance   She will hold the bannister on stairs going down.  She has sometimes had right hip pain. The left leg is numb.   She has some neck pain and shoulder pain.   The left leg is mildly weak -- when weak going downstairs seems unsteady and going up is more difficult.. She notes mild spasticity in legs - better with stretching and worse after laying down all night.  She has some urinary urgency at times abd no incntinence.  Vision is stable.   She has fatigue 90 days and sometimes has difficulty with apathy..   Denies depression but some anxiety.   Cognition is the same with some reduced focus/attention.  She notes some verbal fluency issues.    We had tried Adderall XR 15 mg but she felt more anxious with it and stopped after only a few pills   Her headaches are doing better than last year.  She waa on Vit D 5000  U daily and we discussed restarting.    MS History:  She was diagnosed with MS in May 2019.    In January 2019, [name] woke up with a severe headache in January that did not improve.    Then she had dizziness and nausea in January and February 2019.    Her smile seemed distorted and she had right facial numbness in February 2019.    Around March, she noted difficulty with vision out of the right eye followed by diplopia. In [place] PCP first started to treat her for migraine.   When symptoms persisted, she had a head CT which was read as normal (in retrospect, there is one focus on the left).  At some point, she noted increased urinary frequency,  She saw Dr. Nestor Ramp May 2019.   An MRI was ordered in late May 2019 and she was diagnosed with MS based on the pattern and that multiple lesions enhanced.   She did feel clumsy and fall once in June or July.    She received 5 days of IV Solumedrol in late MAy, early June.     She was prescribed Ocrevus and had the split dose 08/13/2017 and 08/28/2017.         IMAGING The MRI of the brain 5/23/20219 shows multiple T2/FLAIR hyperintense foci in the  hemispheres as well in the posterior pons.  About 7-8 of the lesions were enhancing after contrast.  The largest focus is in the left parietal lobe.       MRI of the cervical and thoracic spine 07/28/2017 showed foci located at C2, T1, T5 and T11.  None of them and enhanced.   She also had lab work in May and June 2019.  The JCV antibody was positive at 1.56.  The neuromyelitis optica antibody was negative.   MRI of the brain 09/01/2018 showed  Multiple T2/flair hyperintense foci in the hemispheres and the right cerebral peduncle in a pattern and configuration consistent with chronic demyelinating plaque associated with multiple sclerosis.  None of the foci enhances on the current study.  Compared to the 07/09/2017 MRI, there are no new lesions. No FH of MS  REVIEW OF SYSTEMS: Out of a complete 14 system review of  symptoms, the patient complains only of the following symptoms, fatigue, headaches and all other reviewed systems are negative.   ALLERGIES: Allergies  Allergen Reactions   Adderall [Amphetamine-Dextroamphetamine]     Pt feels it caused anxiety attack     HOME MEDICATIONS: Outpatient Medications Prior to Visit  Medication Sig Dispense Refill   diazepam (VALIUM) 5 MG tablet Take 5 mg by mouth 2 (two) times daily as needed.     indomethacin (INDOCIN) 25 MG capsule Take 1 capsule (25 mg total) by mouth daily as needed. 30 capsule 2   ocrelizumab (OCREVUS) 300 MG/10ML injection Inject into the vein once.     venlafaxine XR (EFFEXOR-XR) 75 MG 24 hr capsule Take 75 mg by mouth daily.     No facility-administered medications prior to visit.     PAST MEDICAL HISTORY: Past Medical History:  Diagnosis Date   Headache    Multiple sclerosis ([place])    Vision abnormalities      PAST SURGICAL HISTORY: Past Surgical History:  Procedure Laterality Date   BREAST LUMPECTOMY Right 12/06/2020   Procedure: RIGHT BREAST LUMPECTOMY;  Surgeon: [name], MD;  Location: [facility];  Service: General;  Laterality: Right;   CERVICAL CONE BIOPSY       FAMILY HISTORY: Family History  Problem Relation Age of Onset   Hypertension Mother    Diabetes type II Mother    Multiple myeloma Mother    Hypertension Father    Healthy Sister    Healthy Brother    Diabetes Maternal Grandmother    Diabetes Maternal Grandfather    Hypertension Paternal Grandmother    Dementia Paternal Grandmother      SOCIAL HISTORY: Social History   Socioeconomic History   Marital status: Married    Spouse name: Delrae Alfred   Number of children: 2   Years of education: Not on file   Highest education level: Not on file  Occupational History   Not on file  Tobacco Use   Smoking status: Former   Smokeless tobacco: Never  Scientific laboratory technician Use: Never used  Substance and Sexual Activity    Alcohol use: Not Currently   Drug use: Never   Sexual activity: Not on file  Other Topics Concern   Not on file  Social History Narrative   ** Merged History Encounter **       Social Determinants of Health   Financial Resource Strain: Not on file  Food Insecurity: Not on file  Transportation Needs: Not on file  Physical Activity: Not on file  Stress: Not on  file  Social Connections: Not on file  Intimate Partner Violence: Not on file     PHYSICAL EXAM  There were no vitals filed for this visit.  There is no height or weight on file to calculate BMI.  Generalized: Well developed, in no acute distress  Cardiology: normal rate and rhythm, no murmur auscultated  Respiratory: clear to auscultation bilaterally    Neurological examination  Mentation: Alert oriented to time, place, history taking. Follows all commands speech and language fluent Cranial nerve II-XII: Pupils were equal round reactive to light. Extraocular movements were full, visual field were full on confrontational test. Facial sensation and strength were normal. Head turning and shoulder shrug  were normal and symmetric. Motor: The motor testing reveals 5 over 5 strength of all 4 extremities. Good symmetric motor tone is noted throughout.  Sensory: Sensory testing is intact to soft touch on all 4 extremities. No evidence of extinction is noted.  Coordination: Cerebellar testing reveals good finger-nose-finger and heel-to-shin bilaterally.  Gait and station: Gait is normal.  Reflexes: Deep tendon reflexes are symmetric and normal bilaterally.    DIAGNOSTIC DATA (LABS, IMAGING, TESTING) - I reviewed patient records, labs, notes, testing and imaging myself where available.  Lab Results  Component Value Date   WBC 6.9 04/11/2021   HGB 12.8 04/11/2021   HCT 39.6 04/11/2021   MCV 83 04/11/2021   PLT 345 04/11/2021      Component Value Date/Time   NA 137 11/30/2020 0952   K 4.0 11/30/2020 0952   CL 104  11/30/2020 0952   CO2 27 11/30/2020 0952   GLUCOSE 84 11/30/2020 0952   BUN 10 11/30/2020 0952   CREATININE 0.75 11/30/2020 0952   CALCIUM 8.7 (L) 11/30/2020 0952   GFRNONAA >60 11/30/2020 0952   GFRAA >60 04/13/2018 2048   No results found for: "CHOL", "HDL", "LDLCALC", "LDLDIRECT", "TRIG", "CHOLHDL" No results found for: "HGBA1C" No results found for: "VITAMINB12" No results found for: "TSH"      No data to display               No data to display           ASSESSMENT AND PLAN  37 y.o. year old female  has a past medical history of Headache, Multiple sclerosis ([place]), and Vision abnormalities. here with    No diagnosis found.  Araiya is doing well, today. We will continue Ocrevus infusions every 6 months. I will update labs, today. MRI stable 03/2020. She will continue healthy lifestyle habits and regular follow up with PCP. She will return to see Dr Felecia Shelling in 6 months.    No orders of the defined types were placed in this encounter.    No orders of the defined types were placed in this encounter.     Debbora Presto, MSN, FNP-C 04/16/2022, 5:05 PM  [facility] [address] [place], [place] [address] [phone]

## 2022-04-17 ENCOUNTER — Other Ambulatory Visit: Payer: BC Managed Care – PPO

## 2022-04-17 ENCOUNTER — Encounter: Payer: Self-pay | Admitting: Family Medicine

## 2022-04-17 ENCOUNTER — Ambulatory Visit: Payer: BC Managed Care – PPO | Admitting: Family Medicine

## 2022-04-17 VITALS — BP 129/85 | HR 80 | Ht 62.0 in | Wt 152.3 lb

## 2022-04-17 DIAGNOSIS — R5383 Other fatigue: Secondary | ICD-10-CM

## 2022-04-17 DIAGNOSIS — G35 Multiple sclerosis: Secondary | ICD-10-CM

## 2022-04-17 DIAGNOSIS — F419 Anxiety disorder, unspecified: Secondary | ICD-10-CM

## 2022-04-17 DIAGNOSIS — Z79899 Other long term (current) drug therapy: Secondary | ICD-10-CM

## 2022-04-17 DIAGNOSIS — R519 Headache, unspecified: Secondary | ICD-10-CM

## 2022-04-18 DIAGNOSIS — F411 Generalized anxiety disorder: Secondary | ICD-10-CM | POA: Diagnosis not present

## 2022-04-18 LAB — CBC WITH DIFFERENTIAL/PLATELET
Basophils Absolute: 0.1 10*3/uL (ref 0.0–0.2)
Basos: 0 %
EOS (ABSOLUTE): 0 10*3/uL (ref 0.0–0.4)
Eos: 0 %
Hematocrit: 43 % (ref 34.0–46.6)
Hemoglobin: 13.9 g/dL (ref 11.1–15.9)
Immature Grans (Abs): 0 10*3/uL (ref 0.0–0.1)
Immature Granulocytes: 0 %
Lymphocytes Absolute: 3.1 10*3/uL (ref 0.7–3.1)
Lymphs: 27 %
MCH: 27.5 pg (ref 26.6–33.0)
MCHC: 32.3 g/dL (ref 31.5–35.7)
MCV: 85 fL (ref 79–97)
Monocytes Absolute: 1.2 10*3/uL — ABNORMAL HIGH (ref 0.1–0.9)
Monocytes: 10 %
Neutrophils Absolute: 7.2 10*3/uL — ABNORMAL HIGH (ref 1.4–7.0)
Neutrophils: 63 %
Platelets: 356 10*3/uL (ref 150–450)
RBC: 5.05 x10E6/uL (ref 3.77–5.28)
RDW: 12.4 % (ref 11.7–15.4)
WBC: 11.5 10*3/uL — ABNORMAL HIGH (ref 3.4–10.8)

## 2022-04-18 LAB — IGG, IGA, IGM
IgA/Immunoglobulin A, Serum: 105 mg/dL (ref 87–352)
IgG (Immunoglobin G), Serum: 1296 mg/dL (ref 586–1602)
IgM (Immunoglobulin M), Srm: 31 mg/dL (ref 26–217)

## 2022-04-28 ENCOUNTER — Telehealth: Payer: Self-pay | Admitting: *Deleted

## 2022-04-28 NOTE — Telephone Encounter (Signed)
"  Hey there, Marissa Andersen! I did let Dr Felecia Shelling know about the MRI with [facility]. He is aware and we will plan to update imaging at the end of 2024. Your labs look ok with the exception of an elevated white blood cell count. This can indicate an infection of some sort. If you are having any respiratory symptoms or symptoms of a UTI please check in with your primary care provider. I will see you back in 6 months. "  Called and spoke with pt about above lab results. Pt verbalized understanding. Had wisdom teeth removed 04/12/22. She has had no complications post removal, doing well. No signs of infection currently.

## 2022-05-02 DIAGNOSIS — F411 Generalized anxiety disorder: Secondary | ICD-10-CM | POA: Diagnosis not present

## 2022-05-08 DIAGNOSIS — F411 Generalized anxiety disorder: Secondary | ICD-10-CM | POA: Diagnosis not present

## 2022-05-23 DIAGNOSIS — F411 Generalized anxiety disorder: Secondary | ICD-10-CM | POA: Diagnosis not present

## 2022-05-27 DIAGNOSIS — G35 Multiple sclerosis: Secondary | ICD-10-CM | POA: Diagnosis not present

## 2022-06-09 ENCOUNTER — Encounter: Payer: Self-pay | Admitting: Family Medicine

## 2022-06-09 ENCOUNTER — Encounter: Payer: Self-pay | Admitting: Neurology

## 2022-06-10 ENCOUNTER — Encounter (HOSPITAL_COMMUNITY): Payer: Self-pay | Admitting: *Deleted

## 2022-06-10 ENCOUNTER — Ambulatory Visit (INDEPENDENT_AMBULATORY_CARE_PROVIDER_SITE_OTHER): Payer: BC Managed Care – PPO

## 2022-06-10 ENCOUNTER — Ambulatory Visit (HOSPITAL_COMMUNITY)
Admission: EM | Admit: 2022-06-10 | Discharge: 2022-06-10 | Disposition: A | Discharge status: Home / Self Care | Payer: BC Managed Care – PPO | Attending: Family Medicine | Admitting: Family Medicine

## 2022-06-10 DIAGNOSIS — R072 Precordial pain: Secondary | ICD-10-CM | POA: Diagnosis not present

## 2022-06-10 DIAGNOSIS — R079 Chest pain, unspecified: Secondary | ICD-10-CM | POA: Diagnosis not present

## 2022-06-10 DIAGNOSIS — R002 Palpitations: Secondary | ICD-10-CM | POA: Diagnosis not present

## 2022-06-10 LAB — CBC WITH DIFFERENTIAL/PLATELET
Abs Immature Granulocytes: 0.01 10*3/uL (ref 0.00–0.07)
Basophils Absolute: 0 10*3/uL (ref 0.0–0.1)
Basophils Relative: 1 %
Eosinophils Absolute: 0.2 10*3/uL (ref 0.0–0.5)
Eosinophils Relative: 2 %
HCT: 40.7 % (ref 36.0–46.0)
Hemoglobin: 13.5 g/dL (ref 12.0–15.0)
Immature Granulocytes: 0 %
Lymphocytes Relative: 25 %
Lymphs Abs: 1.8 10*3/uL (ref 0.7–4.0)
MCH: 27.6 pg (ref 26.0–34.0)
MCHC: 33.2 g/dL (ref 30.0–36.0)
MCV: 83.1 fL (ref 80.0–100.0)
Monocytes Absolute: 0.4 10*3/uL (ref 0.1–1.0)
Monocytes Relative: 5 %
Neutro Abs: 5 10*3/uL (ref 1.7–7.7)
Neutrophils Relative %: 67 %
Platelets: 311 10*3/uL (ref 150–400)
RBC: 4.9 MIL/uL (ref 3.87–5.11)
RDW: 13.1 % (ref 11.5–15.5)
WBC: 7.4 10*3/uL (ref 4.0–10.5)
nRBC: 0 % (ref 0.0–0.2)

## 2022-06-10 LAB — BASIC METABOLIC PANEL
Anion gap: 8 (ref 5–15)
BUN: 10 mg/dL (ref 6–20)
CO2: 25 mmol/L (ref 22–32)
Calcium: 9 mg/dL (ref 8.9–10.3)
Chloride: 105 mmol/L (ref 98–111)
Creatinine, Ser: 0.78 mg/dL (ref 0.44–1.00)
GFR, Estimated: >60 mL/min (ref >60)
Glucose, Bld: 83 mg/dL (ref 70–99)
Potassium: 3.8 mmol/L (ref 3.5–5.1)
Sodium: 138 mmol/L (ref 135–145)

## 2022-06-10 LAB — TSH: TSH: 1.271 u[IU]/mL (ref 0.350–4.500)

## 2022-06-10 NOTE — Discharge Instructions (Addendum)
The EKG was normal.  Your chest x-ray was normal and did not show any fluid or mass or pneumonia.  We have drawn blood to check your blood counts and your sodium and potassium and thyroid function.  If there is anything abnormal that needs treatment, our staff will call you  Please follow-up with your primary care

## 2022-06-10 NOTE — ED Provider Notes (Signed)
[facility]    CSN: [id number] Arrival date & time: 06/10/22  9562      History   Chief Complaint No chief complaint on file.   HPI KYMIA SIMI is a 37 y.o. female.   HPI  here for palpitations with some chest tightness and pain.  She can also feel dizzy with this.  It will last for a few minutes at a time, and can happen numerous times throughout the day and night.  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      Home Medications    Prior to Admission medications   Medication Sig Start Date End Date Taking? Authorizing Provider  ocrelizumab (OCREVUS) 300 MG/10ML injection Inject into the vein once.   Yes [provider]    Family History Family History  Problem Relation Age of Onset   Hypertension Mother    Diabetes type II Mother    Multiple myeloma Mother    Hypertension Father    Healthy Sister    Healthy Brother    Diabetes Maternal Grandmother    Diabetes Maternal Grandfather    Hypertension Paternal Grandmother    Dementia Paternal Grandmother     Social  History Social History   Tobacco Use   Smoking status: Never   Smokeless tobacco: Never  Vaping Use   Vaping Use: Never used  Substance Use Topics   Alcohol use: Yes    Comment: socially   Drug use: Never     Allergies   Adderall [amphetamine-dextroamphetamine]   Review of Systems Review of Systems   Physical Exam Triage Vital Signs ED Triage Vitals  Enc Vitals Group     BP 06/10/22 0908 (!) 137/93     Pulse Rate 06/10/22 0908 73     Resp 06/10/22 0908 18     Temp 06/10/22 0908 98.9 F (37.2 C)     Temp Source 06/10/22 0908 Oral     SpO2 06/10/22 0908 100 %     Weight --      Height --      Head Circumference --      Peak Flow --      Pain Score 06/10/22 0906 0     Pain Loc --      Pain Edu? --      Excl. in GC? --    No data found.  Updated Vital Signs BP (!) 137/93   Pulse 73   Temp 98.9 F (37.2 C) (Oral)   Resp 18   LMP 05/19/2022 (Exact  Date)   SpO2 100%   Visual Acuity Right Eye Distance:   Left Eye Distance:   Bilateral Distance:    Right Eye Near:   Left Eye Near:    Bilateral Near:     Physical Exam Vitals reviewed.  Constitutional:      General: She is not in acute distress.    Appearance: She is not ill-appearing, toxic-appearing or diaphoretic.  HENT:     Nose: Nose normal.     Mouth/Throat:     Mouth: Mucous membranes are moist.     Pharynx: No oropharyngeal exudate or posterior oropharyngeal erythema.  Eyes:     Extraocular Movements: Extraocular movements intact.     Conjunctiva/sclera: Conjunctivae normal.     Pupils: Pupils are equal, round, and reactive to light.  Cardiovascular:     Rate and Rhythm: Normal rate and regular rhythm.     Heart sounds: No murmur heard. Pulmonary:     Effort: Pulmonary effort is normal.     Breath sounds: Normal breath sounds.  Musculoskeletal:     Cervical back: Neck supple.  Lymphadenopathy:     Cervical: No cervical adenopathy.  Skin:    Coloration: Skin is not jaundiced or pale.   Neurological:     General: No focal deficit present.     Mental Status: She is alert and oriented to person, place, and time.  Psychiatric:        Behavior: Behavior normal.      UC Treatments / Results  Labs (all labs ordered are listed, but only abnormal results are displayed) Labs Reviewed  CBC WITH DIFFERENTIAL/PLATELET  BASIC METABOLIC PANEL  TSH    EKG   Radiology DG Chest 2 View  Result Date: 06/10/2022 CLINICAL DATA:  Chest pain EXAM: CHEST - 2 VIEW COMPARISON:  09/12/2021 FINDINGS: Cardiac shadow is within normal limits. The lungs are clear bilaterally. No focal infiltrate or effusion is seen. No bony abnormality is noted. IMPRESSION: No active cardiopulmonary disease. Electronically Signed   By: Alcide Clever M.D.   On: 06/10/2022 11:20    Procedures Procedures (including critical care time)  Medications Ordered in UC Medications - No data to display  Initial Impression / Assessment and Plan / UC Course  I have reviewed the triage vital signs and the nursing notes.  Pertinent labs & imaging results that were available during my care of the patient were reviewed by me and considered in my medical decision making (see chart for details).       EKG is normal. Chest x-ray is normal. We have drawn labs to check her CBC, CMP, and thyroid function.  Will notify her of any significant abnormalities.  I am asking her to follow-up with primary care, and she is given contact information for cardiology. Final Clinical Impressions(s) / UC Diagnoses   Final diagnoses:  Palpitations  Precordial pain     Discharge Instructions      The EKG was normal.  Your chest x-ray was normal and did not show any fluid or mass or pneumonia.  We have drawn blood to check your blood counts and your sodium and potassium and thyroid function.  If there is anything abnormal that needs treatment, our staff will call you  Please follow-up with your primary care     ED  Prescriptions   None    PDMP not reviewed this encounter.   Zenia Resides, MD 06/10/22 1130

## 2022-06-10 NOTE — ED Triage Notes (Signed)
Pt states she is having heart palpations, chest tightness, SOB on and off x 1 week. She states her sx are worse at night. She states she has multiple scolaris. She did have her infusion a couple weeks ago.

## 2022-06-13 DIAGNOSIS — F411 Generalized anxiety disorder: Secondary | ICD-10-CM | POA: Diagnosis not present

## 2022-06-19 DIAGNOSIS — F411 Generalized anxiety disorder: Secondary | ICD-10-CM | POA: Diagnosis not present

## 2022-06-23 DIAGNOSIS — R030 Elevated blood-pressure reading, without diagnosis of hypertension: Secondary | ICD-10-CM | POA: Diagnosis not present

## 2022-06-23 DIAGNOSIS — G35 Multiple sclerosis: Secondary | ICD-10-CM | POA: Diagnosis not present

## 2022-06-23 DIAGNOSIS — R002 Palpitations: Secondary | ICD-10-CM | POA: Diagnosis not present

## 2022-06-27 ENCOUNTER — Ambulatory Visit: Payer: BC Managed Care – PPO | Attending: Internal Medicine | Admitting: Internal Medicine

## 2022-06-27 ENCOUNTER — Encounter: Payer: Self-pay | Admitting: Internal Medicine

## 2022-06-27 VITALS — BP 142/90 | HR 66 | Ht 62.0 in | Wt 156.6 lb

## 2022-06-27 DIAGNOSIS — R002 Palpitations: Secondary | ICD-10-CM | POA: Diagnosis not present

## 2022-06-27 DIAGNOSIS — F411 Generalized anxiety disorder: Secondary | ICD-10-CM | POA: Diagnosis not present

## 2022-06-27 NOTE — Patient Instructions (Signed)
Medication Instructions:  No changes *If you need a refill on your cardiac medications before your next appointment, please call your pharmacy*  Follow-Up: At [facility], you and your health needs are our priority.  As part of our continuing mission to provide you with exceptional heart care, we have created designated Provider Care Teams.  These Care Teams include your primary Cardiologist (physician) and Advanced Practice Providers (APPs -  Physician Assistants and Nurse Practitioners) who all work together to provide you with the care you need, when you need it.  We recommend signing up for the patient portal called "MyChart".  Sign up information is provided on this After Visit Summary.  MyChart is used to connect with patients for Virtual Visits (Telemedicine).  Patients are able to view lab/test results, encounter notes, upcoming appointments, etc.  Non-urgent messages can be sent to your provider as well.   To learn more about what you can do with MyChart, go to [email or web address].    Your next appointment:    Follow up as needed according to results  Provider:   Dr Wyline Mood

## 2022-06-27 NOTE — Progress Notes (Signed)
Cardiology Office Note:    Date:  06/27/2022   ID:  Marissa Andersen, DOB 03/04/1985, MRN [id number]  PCP:  Milus Height, PA   [facility] Providers Cardiologist:  None     Referring MD: Milus Height, PA   No chief complaint on file. Palpitations  History of Present Illness:    Marissa Andersen is a 37 y.o. female with a hx of multiple sclerosis, referral for palpitations. She's wearing a cardiac monitor now for 5 days. Planned for 14 days. She pressed a few times. Has palpitations at night. No syncope. She cut back on caffeine. Not hydrating as well. When she was pregnant she had borderline pre-eclampsia. She was on antihypertensives. She was placed on verapamil, spironolactone. No signs of apnea/snoring. Father had a stroke, and hx of MI. HTN. CHF in his late 60s. Mother has HTN. EKG was normal 06/10/2022.  Past Medical History:  Diagnosis Date   Headache    Multiple sclerosis ([facility])    Vision abnormalities     Past Surgical History:  Procedure Laterality Date   BREAST LUMPECTOMY Right 12/06/2020   Procedure: RIGHT BREAST LUMPECTOMY;  Surgeon: Abigail Miyamoto, MD;  Location: [facility];  Service: General;  Laterality: Right;   CERVICAL CONE BIOPSY      Current Medications: No outpatient medications have been marked as taking for the 06/27/22 encounter (Appointment) with Maisie Fus, MD.     Allergies:   Adderall [amphetamine-dextroamphetamine]   Social History   Socioeconomic History   Marital status: Married    Spouse name: Gaspar Garbe   Number of children: 2   Years of education: Not on file   Highest education level: Not on file  Occupational History   Not on file  Tobacco Use   Smoking status: Never   Smokeless tobacco: Never  Vaping Use   Vaping Use: Never used  Substance and Sexual Activity   Alcohol use: Yes    Comment: socially   Drug use: Never   Sexual activity: Yes    Birth control/protection: None  Other Topics  Concern   Not on file  Social History Narrative   ** Merged History Encounter **       Social Determinants of Health   Financial Resource Strain: Not on file  Food Insecurity: Not on file  Transportation Needs: Not on file  Physical Activity: Not on file  Stress: Not on file  Social Connections: Not on file     Family History: The patient's family history includes Dementia in her paternal grandmother; Diabetes in her maternal grandfather and maternal grandmother; Diabetes type II in her mother; Healthy in her brother and sister; Hypertension in her father, mother, and paternal grandmother; Multiple myeloma in her mother.  ROS:   Please see the history of present illness.     All other systems reviewed and are negative.  EKGs/Labs/Other Studies Reviewed:    The following studies were reviewed today:   EKG:  EKG is  ordered today.  The ekg ordered today demonstrates   06/27/2022- NSR, nl QTc  Recent Labs: 06/10/2022: BUN 10; Creatinine, Ser 0.78; Hemoglobin 13.5; Platelets 311; Potassium 3.8; Sodium 138; TSH 1.271  Recent Lipid Panel No results found for: "CHOL", "TRIG", "HDL", "CHOLHDL", "VLDL", "LDLCALC", "LDLDIRECT"   Risk Assessment/Calculations:    Physical Exam:    VS:   Vitals:   06/27/22 1144  BP: (!) 142/90  Pulse: 66  SpO2: 100%    LMP 05/19/2022 (Exact Date)  Wt Readings from Last 3 Encounters:  04/17/22 152 lb 4.8 oz (69.1 kg)  10/09/21 152 lb (68.9 kg)  04/11/21 152 lb (68.9 kg)     GEN:  Well nourished, well developed in no acute distress HEENT: Normal NECK: No JVD CARDIAC: RRR, no murmurs, rubs, gallops RESPIRATORY:  Clear to auscultation without rales, wheezing or rhonchi  ABDOMEN: Soft, non-tender, non-distended MUSCULOSKELETAL:  No edema; No deformity  SKIN: Warm and dry NEUROLOGIC:  Alert and oriented x 3 PSYCHIATRIC:  Normal affect   ASSESSMENT:    Palpitations: no red flags so far. Will evaluate her monitor when it is  complete.   Elevated Blood pressure: has hx of borderline pre-eclampsia per patient. We discussed exercising and ambulatory monitoring. She will keep a log. If consistently > 130/80 mg daily, her PCP can start therapy. Can plan for chlorthalidone 25 and norvasc 2.5 mg daily.  PLAN:    In order of problems listed above:  Follow up pending the results, they can be sent to my inbox     Medication Adjustments/Labs and Tests Ordered: Current medicines are reviewed at length with the patient today.  Concerns regarding medicines are outlined above.  No orders of the defined types were placed in this encounter.  No orders of the defined types were placed in this encounter.   There are no Patient Instructions on file for this visit.   Signed, Maisie Fus, MD  06/27/2022 8:59 AM    [facility]

## 2022-07-10 DIAGNOSIS — F411 Generalized anxiety disorder: Secondary | ICD-10-CM | POA: Diagnosis not present

## 2022-07-16 ENCOUNTER — Encounter: Payer: Self-pay | Admitting: Neurology

## 2022-07-17 DIAGNOSIS — R002 Palpitations: Secondary | ICD-10-CM | POA: Diagnosis not present

## 2022-07-18 ENCOUNTER — Telehealth: Payer: Self-pay | Admitting: Internal Medicine

## 2022-07-18 DIAGNOSIS — F411 Generalized anxiety disorder: Secondary | ICD-10-CM | POA: Diagnosis not present

## 2022-07-18 NOTE — Telephone Encounter (Signed)
Spoke to patient. She state she received results from primary [place] [facility])  concerning Zio monitor.   Patient wanted to know if Dr Wyline Mood is able to see result and/or has comments.  RN informed patient . Marissa Andersen -will need to send Zio result to  Dr Wyline Mood  for review.  Once reviewed ,will contact patient.  Patient verbalized understanding and states she will contact PCP office.

## 2022-07-18 NOTE — Telephone Encounter (Signed)
Pt stated her Zio results came back and she'd like to know is MD received them. Pt is requesting a callback to discuss further so that she can be advised on what to do next. Please advise

## 2022-07-19 ENCOUNTER — Emergency Department (HOSPITAL_COMMUNITY)
Admission: EM | Admit: 2022-07-19 | Discharge: 2022-07-19 | Disposition: A | Discharge status: Home / Self Care | Payer: BC Managed Care – PPO | Attending: Emergency Medicine | Admitting: Emergency Medicine

## 2022-07-19 ENCOUNTER — Emergency Department (HOSPITAL_COMMUNITY): Payer: BC Managed Care – PPO

## 2022-07-19 ENCOUNTER — Encounter (HOSPITAL_COMMUNITY): Payer: Self-pay | Admitting: *Deleted

## 2022-07-19 ENCOUNTER — Other Ambulatory Visit: Payer: Self-pay

## 2022-07-19 DIAGNOSIS — Z5321 Procedure and treatment not carried out due to patient leaving prior to being seen by health care provider: Secondary | ICD-10-CM | POA: Diagnosis not present

## 2022-07-19 DIAGNOSIS — R0602 Shortness of breath: Secondary | ICD-10-CM | POA: Diagnosis not present

## 2022-07-19 DIAGNOSIS — R072 Precordial pain: Secondary | ICD-10-CM | POA: Insufficient documentation

## 2022-07-19 DIAGNOSIS — R079 Chest pain, unspecified: Secondary | ICD-10-CM | POA: Diagnosis not present

## 2022-07-19 LAB — CBC
HCT: 41.7 % (ref 36.0–46.0)
Hemoglobin: 13.4 g/dL (ref 12.0–15.0)
MCH: 26.8 pg (ref 26.0–34.0)
MCHC: 32.1 g/dL (ref 30.0–36.0)
MCV: 83.4 fL (ref 80.0–100.0)
Platelets: 349 10*3/uL (ref 150–400)
RBC: 5 MIL/uL (ref 3.87–5.11)
RDW: 12.4 % (ref 11.5–15.5)
WBC: 6.2 10*3/uL (ref 4.0–10.5)
nRBC: 0 % (ref 0.0–0.2)

## 2022-07-19 LAB — BASIC METABOLIC PANEL
Anion gap: 11 (ref 5–15)
BUN: 12 mg/dL (ref 6–20)
CO2: 27 mmol/L (ref 22–32)
Calcium: 9.3 mg/dL (ref 8.9–10.3)
Chloride: 99 mmol/L (ref 98–111)
Creatinine, Ser: 0.86 mg/dL (ref 0.44–1.00)
GFR, Estimated: >60 mL/min (ref >60)
Glucose, Bld: 93 mg/dL (ref 70–99)
Potassium: 3.3 mmol/L — ABNORMAL LOW (ref 3.5–5.1)
Sodium: 137 mmol/L (ref 135–145)

## 2022-07-19 LAB — TROPONIN I (HIGH SENSITIVITY)
Troponin I (High Sensitivity): 5 ng/L (ref <18)
Troponin I (High Sensitivity): 3 ng/L (ref <18)

## 2022-07-19 LAB — I-STAT BETA HCG BLOOD, ED (MC, WL, AP ONLY): I-stat hCG, quantitative: <5 m[IU]/mL (ref <5)

## 2022-07-19 NOTE — ED Notes (Signed)
Patient seen walking out of ED and getting into car 

## 2022-07-19 NOTE — ED Triage Notes (Addendum)
Pt states she started amlodipine and chlorthalidone 3 days ago. States increasing sternal chest pain and sob since starting meds.  States recently placed on cardiac monitor that showed "some kind of heart block".

## 2022-07-24 ENCOUNTER — Encounter: Payer: Self-pay | Admitting: Internal Medicine

## 2022-07-25 ENCOUNTER — Encounter: Payer: Self-pay | Admitting: Internal Medicine

## 2022-07-25 NOTE — Telephone Encounter (Signed)
Please see other MyChart encounter

## 2022-08-06 DIAGNOSIS — F411 Generalized anxiety disorder: Secondary | ICD-10-CM | POA: Diagnosis not present

## 2022-08-06 DIAGNOSIS — E876 Hypokalemia: Secondary | ICD-10-CM | POA: Diagnosis not present

## 2022-08-12 DIAGNOSIS — R002 Palpitations: Secondary | ICD-10-CM | POA: Diagnosis not present

## 2022-08-12 DIAGNOSIS — F418 Other specified anxiety disorders: Secondary | ICD-10-CM | POA: Diagnosis not present

## 2022-08-12 DIAGNOSIS — R030 Elevated blood-pressure reading, without diagnosis of hypertension: Secondary | ICD-10-CM | POA: Diagnosis not present

## 2022-08-12 DIAGNOSIS — G35 Multiple sclerosis: Secondary | ICD-10-CM | POA: Diagnosis not present

## 2022-08-15 DIAGNOSIS — F411 Generalized anxiety disorder: Secondary | ICD-10-CM | POA: Diagnosis not present

## 2022-08-18 ENCOUNTER — Encounter: Payer: Self-pay | Admitting: Cardiology

## 2022-08-18 ENCOUNTER — Ambulatory Visit: Payer: BC Managed Care – PPO | Admitting: Cardiology

## 2022-08-18 VITALS — BP 132/88 | HR 65 | Ht 62.0 in | Wt 154.0 lb

## 2022-08-18 DIAGNOSIS — R072 Precordial pain: Secondary | ICD-10-CM

## 2022-08-18 DIAGNOSIS — R002 Palpitations: Secondary | ICD-10-CM

## 2022-08-18 NOTE — Progress Notes (Signed)
Patient referred by Milus Height, PA for palpitations  Subjective:   Marissa Andersen, female    DOB: 1985-09-22, 37 y.o.   MRN: [id number]   Chief Complaint  Patient presents with   Palpitations   New Patient (Initial Visit)     HPI  37 y.o. African-American female with multiple sclerosis, palpitations, chest pain  Patient was seen by heart care cardiologist Dr. Wyline Mood in 06/2022.  She wore Zio patch in 06/2022 that showed no arrhythmia.  Patient is here for second opinion.  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  Possible weeks, she has had episodes of sudden awakening from sleep, being startled, followed by palpitations, heart racing, occasional chest pain.  Recently, she has had a few episodes of similar symptoms during the day time as well, but this is rare.  She does not have palpitations during exercise, but has occasional chest pain that is very short lasting.  Chest pain is nonfocal, occurs on the right side, central, as well as left-sided, no particular worsening with exertion, it also occurs at rest mostly      Past Medical History:  Diagnosis Date   Headache    Multiple sclerosis (HCC)    Vision abnormalities      Past Surgical History:  Procedure Laterality Date   BREAST LUMPECTOMY Right 12/06/2020   Procedure: RIGHT BREAST LUMPECTOMY;  Surgeon: Abigail Miyamoto, MD;  Location: [facility];  Service: General;  Laterality: Right;   CERVICAL CONE BIOPSY       Social History   Tobacco Use  Smoking Status Never  Smokeless Tobacco Never    Social History   Substance and Sexual Activity  Alcohol Use Yes   Comment: socially     Family History  Problem Relation Age of Onset   Hypertension Mother    Diabetes type II Mother    Multiple myeloma Mother    Hypertension Father    Healthy Sister    Healthy Brother    Diabetes Maternal Grandmother    Diabetes Maternal Grandfather    Hypertension Paternal  Grandmother    Dementia Paternal Grandmother       Current Outpatient Medications:    ocrelizumab (OCREVUS) 300 MG/10ML injection, Inject into the vein once., Disp: , Rfl:    Cardiovascular and other pertinent studies:  Reviewed external labs and tests, independently interpreted  EKG 07/19/2022: Sinus rhythm 80 bpm Normal EKG  Mobile cardiac telemetry 11 days 06/23/2022 - 07/05/2022: Dominant rhythm: Sinus. HR 54-153 bpm. Avg HR 81 bpm. 0 episodes of SVT. <1% isolated SVE, no couplet/triplets. 0 episodes of VT. <1% isolated VE, no couplet/triplets. No atrial fibrillation/atrial flutter/SVT/VT/high grade AV block, sinus pause >3sec noted. 42 patient triggered events, correlated with sinus rhythm, no arrhythmia.    Recent labs: 07/19/2022: Glucose 93, BUN/Cr 12/0.86. EGFR >60. Na/K 137/3.3.  Trop HS 5,3 H/H 13/41. MCV 83. Platelets 349 TSH 1.2 normal    Review of Systems  Cardiovascular:  Positive for chest pain and palpitations. Negative for dyspnea on exertion, leg swelling and syncope.        Vitals:   08/18/22 0957  BP: 132/88  Pulse: 65  SpO2: 100%     Body mass index is 28.17 kg/m. Filed Weights   08/18/22 0957  Weight: 154 lb (69.9 kg)     Objective:   Physical Exam Vitals and nursing note reviewed.  Constitutional:      General: She is not in acute distress. Neck:  Vascular: No JVD.  Cardiovascular:     Rate and Rhythm: Normal rate and regular rhythm.     Heart sounds: Normal heart sounds. No murmur heard. Pulmonary:     Effort: Pulmonary effort is normal.     Breath sounds: Normal breath sounds. No wheezing or rales.  Musculoskeletal:     Right lower leg: No edema.     Left lower leg: No edema.          Visit diagnoses:   ICD-10-CM   1. Palpitations  R00.2     2. Precordial pain  R07.2 PCV CARDIAC STRESS TEST    PCV ECHOCARDIOGRAM COMPLETE       Orders Placed This Encounter  Procedures   PCV CARDIAC STRESS TEST   PCV  ECHOCARDIOGRAM COMPLETE      Assessment & Recommendations:    37 y.o. African-American female with multiple sclerosis, palpitations, chest pain  Palpitations: No arrhythmia noted on Zio patch monitoring. I do suspect her symptoms are primarily originated by sleep related issues, possibly anxiety, that further lead to chest pain palpitations.  Nevertheless, with her chest pain, I will only obtain echocardiogram and exercise treadmill stress test to complete the workup.  No further cardiac workup is necessary, if above testing is normal.  I will see her on as-needed basis.  Thank you for referring the patient to [place]. Please feel free to contact with any questions.   Elder Negus, MD Pager: [phone] Office: [phone]

## 2022-08-26 ENCOUNTER — Ambulatory Visit: Payer: BC Managed Care – PPO | Attending: Cardiology | Admitting: Internal Medicine

## 2022-08-26 NOTE — Progress Notes (Deleted)
Cardiology Office Note:    Date:  08/26/2022   ID:  Marissa Andersen, DOB Apr 23, 1985, MRN [id number]  PCP:  Milus Height, PA   [facility] Providers Cardiologist:  None     Referring MD: Milus Height, PA   No chief complaint on file. Palpitations  History of Present Illness:    Marissa Andersen is a 37 y.o. female with a hx of multiple sclerosis, referral for palpitations. She's wearing a cardiac monitor now for 5 days. Planned for 14 days. She pressed a few times. Has palpitations at night. No syncope. She cut back on caffeine. Not hydrating as well. When she was pregnant she had borderline pre-eclampsia. She was on antihypertensives. She was placed on verapamil, spironolactone. No signs of apnea/snoring. Father had a stroke, and hx of MI. HTN. CHF in his late 31s. Mother has HTN. EKG was normal 06/10/2022.  Interim Hx 08/26/2022 She had a benign cardiac monitor. She saw Dr. Rosemary Holms and reported some CP. He ordered an echo and stress test that have not been scheduled.  Past Medical History:  Diagnosis Date   Headache    Multiple sclerosis (HCC)    Vision abnormalities     Past Surgical History:  Procedure Laterality Date   BREAST LUMPECTOMY Right 12/06/2020   Procedure: RIGHT BREAST LUMPECTOMY;  Surgeon: Abigail Miyamoto, MD;  Location: [facility];  Service: General;  Laterality: Right;   CERVICAL CONE BIOPSY      Current Medications: No outpatient medications have been marked as taking for the 08/26/22 encounter (Appointment) with Maisie Fus, MD.     Allergies:   Adderall [amphetamine-dextroamphetamine]   Social History   Socioeconomic History   Marital status: Married    Spouse name: Gaspar Garbe   Number of children: 2   Years of education: Not on file   Highest education level: Not on file  Occupational History   Not on file  Tobacco Use   Smoking status: Never   Smokeless tobacco: Never  Vaping Use   Vaping Use: Never used   Substance and Sexual Activity   Alcohol use: Yes    Comment: socially   Drug use: Never   Sexual activity: Yes    Birth control/protection: None  Other Topics Concern   Not on file  Social History Narrative   ** Merged History Encounter **       Social Determinants of Health   Financial Resource Strain: Not on file  Food Insecurity: Not on file  Transportation Needs: Not on file  Physical Activity: Not on file  Stress: Not on file  Social Connections: Not on file     Family History: The patient's family history includes Dementia in her paternal grandmother; Diabetes in her maternal grandfather and maternal grandmother; Diabetes type II in her mother; Healthy in her brother and sister; Heart attack in her father; Hypertension in her father, mother, and paternal grandmother; Multiple myeloma in her mother; Stroke in her father.  ROS:   Please see the history of present illness.     All other systems reviewed and are negative.  EKGs/Labs/Other Studies Reviewed:    The following studies were reviewed today:   EKG:  EKG is  ordered today.  The ekg ordered today demonstrates   06/27/2022- NSR, nl QTc  Recent Labs: 06/10/2022: TSH 1.271 07/19/2022: BUN 12; Creatinine, Ser 0.86; Hemoglobin 13.4; Platelets 349; Potassium 3.3; Sodium 137  Recent Lipid Panel No results found for: "CHOL", "TRIG", "HDL", "CHOLHDL", "VLDL", "  LDLCALC", "LDLDIRECT"   Risk Assessment/Calculations:    Physical Exam:    VS:   There were no vitals filed for this visit.   There were no vitals taken for this visit.    Wt Readings from Last 3 Encounters:  08/18/22 154 lb (69.9 kg)  07/19/22 156 lb 8.4 oz (71 kg)  06/27/22 156 lb 9.6 oz (71 kg)     GEN:  Well nourished, well developed in no acute distress HEENT: Normal NECK: No JVD CARDIAC: RRR, no murmurs, rubs, gallops RESPIRATORY:  Clear to auscultation without rales, wheezing or rhonchi  ABDOMEN: Soft, non-tender,  non-distended MUSCULOSKELETAL:  No edema; No deformity  SKIN: Warm and dry NEUROLOGIC:  Alert and oriented x 3 PSYCHIATRIC:  Normal affect   ASSESSMENT:    Palpitations: Monitor was benign. Agree likely related to anxiety  Elevated Blood pressure: has hx of borderline pre-eclampsia per patient. We discussed exercising and ambulatory monitoring. She will keep a log. If consistently > 130/80 mg daily, her PCP can start therapy. Can plan for chlorthalidone 25 and norvasc 2.5 mg daily.  PLAN:    In order of problems listed above:  Propanolol 20 mg BID PRN     Medication Adjustments/Labs and Tests Ordered: Current medicines are reviewed at length with the patient today.  Concerns regarding medicines are outlined above.  No orders of the defined types were placed in this encounter.  No orders of the defined types were placed in this encounter.   There are no Patient Instructions on file for this visit.   Signed, Maisie Fus, MD  08/26/2022 10:39 AM    [facility]

## 2022-08-27 ENCOUNTER — Encounter: Payer: Self-pay | Admitting: Internal Medicine

## 2022-08-28 DIAGNOSIS — F411 Generalized anxiety disorder: Secondary | ICD-10-CM | POA: Diagnosis not present

## 2022-09-05 DIAGNOSIS — F411 Generalized anxiety disorder: Secondary | ICD-10-CM | POA: Diagnosis not present

## 2022-09-19 DIAGNOSIS — F411 Generalized anxiety disorder: Secondary | ICD-10-CM | POA: Diagnosis not present

## 2022-09-26 DIAGNOSIS — F411 Generalized anxiety disorder: Secondary | ICD-10-CM | POA: Diagnosis not present

## 2022-09-30 DIAGNOSIS — F411 Generalized anxiety disorder: Secondary | ICD-10-CM | POA: Diagnosis not present

## 2022-10-08 DIAGNOSIS — N946 Dysmenorrhea, unspecified: Secondary | ICD-10-CM | POA: Diagnosis not present

## 2022-10-08 DIAGNOSIS — R102 Pelvic and perineal pain: Secondary | ICD-10-CM | POA: Diagnosis not present

## 2022-10-10 DIAGNOSIS — F411 Generalized anxiety disorder: Secondary | ICD-10-CM | POA: Diagnosis not present

## 2022-10-29 DIAGNOSIS — F411 Generalized anxiety disorder: Secondary | ICD-10-CM | POA: Diagnosis not present

## 2022-10-30 NOTE — Progress Notes (Signed)
Chief Complaint  Patient presents with   Follow-up    Pt in room 1. Here for MS follow up. Pt reports doing well. No concerns.     HISTORY OF PRESENT ILLNESS:  11/04/22 ALL:  Marissa Andersen is a 37 y.o. female here today for follow up for RRMS. She continues Ocrevus infusions. Last infusion 4/9//2024 and scheduled for next infusion 11/2022. Last imaging brain and cervical 06/2021 stable (care everywhere).   She is doing well. No new or exacerbating symptoms. She feels gait is stable. No falls. She does not use assistive device. She has intermittent left leg numbness.   She stays at home with her daughter. Mood is good. She discontinued venlafaxine. She is sleeping fairly well. She does wake in the middle of the night but usually able to get back to sleep without difficulty. Headaches are rare. She uses indomethacin as needed.  She has occasional urinary urgency but denies frequency or incontinence. No bowel changes. No vision changes.   HISTORY (copied from Dr Bonnita Hollow previous note)  Marissa Andersen is a 37 y.o. woman with relapsing remitting multiple sclerosis.     Update 10/09/2021: She is on Ocrevus as her disease modifying therapy since diagnosis in June 2019.  Her next infusion will be 04/10/20.  She has generally tolerated it well.     Gait is stable with no falls or major problem with balance   She will hold the bannister on stairs going down.  She has sometimes had right hip pain. The left leg is numb.   She has some neck pain and shoulder pain.   The left leg is mildly weak -- when weak going downstairs seems unsteady and going up is more difficult.. She notes mild spasticity in legs - better with stretching and worse after laying down all night.  She has some urinary urgency at times abd no incntinence.  Vision is stable.   She has fatigue 90 days and sometimes has difficulty with apathy..   Denies depression but some anxiety.   Cognition is the same with some reduced  focus/attention.  She notes some verbal fluency issues.    We had tried Adderall XR 15 mg but she felt more anxious with it and stopped after only a few pills   Her headaches are doing better than last year.  She waa on Vit D 5000 U daily and we discussed restarting.    MS History:  She was diagnosed with MS in May 2019.    In January 2019, [name] woke up with a severe headache in January that did not improve.    Then she had dizziness and nausea in January and February 2019.    Her smile seemed distorted and she had right facial numbness in February 2019.    Around March, she noted difficulty with vision out of the right eye followed by diplopia. In [place] PCP first started to treat her for migraine.   When symptoms persisted, she had a head CT which was read as normal (in retrospect, there is one focus on the left).  At some point, she noted increased urinary frequency,  She saw Dr. Roxanna Mew May 2019.   An MRI was ordered in late May 2019 and she was diagnosed with MS based on the pattern and that multiple lesions enhanced.   She did feel clumsy and fall once in June or July.    She received 5 days of IV Solumedrol in late [place]y, early June.  She was prescribed Ocrevus and had the split dose 08/13/2017 and 08/28/2017.         IMAGING The MRI of the brain 5/23/20219 shows multiple T2/FLAIR hyperintense foci in the hemispheres as well in the posterior pons.  About 7-8 of the lesions were enhancing after contrast.  The largest focus is in the left parietal lobe.       MRI of the cervical and thoracic spine 07/28/2017 showed foci located at C2, T1, T5 and T11.  None of them and enhanced.   She also had lab work in May and June 2019.  The JCV antibody was positive at 1.56.  The neuromyelitis optica antibody was negative.   MRI of the brain 09/01/2018 showed  Multiple T2/flair hyperintense foci in the hemispheres and the right cerebral peduncle in a pattern and configuration consistent with chronic  demyelinating plaque associated with multiple sclerosis.  None of the foci enhances on the current study.  Compared to the 07/09/2017 MRI, there are no new lesions. No FH of MS  REVIEW OF SYSTEMS: Out of a complete 14 system review of symptoms, the patient complains only of the following symptoms, fatigue, headaches and all other reviewed systems are negative.   ALLERGIES: Allergies  Allergen Reactions   Adderall [Amphetamine-Dextroamphetamine] Other (See Comments)    Pt feels it caused anxiety attack     HOME MEDICATIONS: Outpatient Medications Prior to Visit  Medication Sig Dispense Refill   ocrelizumab (OCREVUS) 300 MG/10ML injection Inject into the vein once.     No facility-administered medications prior to visit.     PAST MEDICAL HISTORY: Past Medical History:  Diagnosis Date   Headache    Multiple sclerosis (HCC)    Vision abnormalities      PAST SURGICAL HISTORY: Past Surgical History:  Procedure Laterality Date   BREAST LUMPECTOMY Right 12/06/2020   Procedure: RIGHT BREAST LUMPECTOMY;  Surgeon: Abigail Miyamoto, MD;  Location: [facility];  Service: General;  Laterality: Right;   CERVICAL CONE BIOPSY       FAMILY HISTORY: Family History  Problem Relation Age of Onset   Hypertension Mother    Diabetes type II Mother    Multiple myeloma Mother    Heart attack Father    Hypertension Father    Stroke Father    Healthy Sister    Healthy Brother    Diabetes Maternal Grandmother    Diabetes Maternal Grandfather    Hypertension Paternal Grandmother    Dementia Paternal Grandmother      SOCIAL HISTORY: Social History   Socioeconomic History   Marital status: Married    Spouse name: Gaspar Garbe   Number of children: 2   Years of education: Not on file   Highest education level: Not on file  Occupational History   Not on file  Tobacco Use   Smoking status: Never   Smokeless tobacco: Never  Vaping Use   Vaping status: Never Used   Substance and Sexual Activity   Alcohol use: Yes    Comment: socially   Drug use: Never   Sexual activity: Yes    Birth control/protection: None  Other Topics Concern   Not on file  Social History Narrative   ** Merged History Encounter **       Social Determinants of Health   Financial Resource Strain: Low Risk  (06/28/2021)   Received from [employer], [facility]   Overall Financial Resource Strain (CARDIA)    Difficulty of Paying Living Expenses: Not hard  at all  Food Insecurity: No Food Insecurity (06/28/2021)   Received from [facility], [facility]   Hunger Vital Sign    Worried About Running Out of Food in the Last Year: Never true    Ran Out of Food in the Last Year: Never true  Transportation Needs: Not on file  Physical Activity: Insufficiently Active (06/28/2021)   Received from [facility]   Exercise Vital Sign    Days of Exercise per Week: 3 days    Minutes of Exercise per Session: 30 min  Stress: Stress Concern Present (06/28/2021)   Received from [facility], [facility] - Occupational Stress Questionnaire    Feeling of Stress : To some extent  Social Connections: Unknown (06/30/2022)   Received from [facility], [facility]   Social Network    Social Network: Not on file  Intimate Partner Violence: Unknown (05/22/2021)   Received from [facility], [facility]   HITS    Physically Hurt: Not on file    Insult or Talk Down To: Not on file    Threaten Physical Harm: Not on file    Scream or Curse: Not on file     PHYSICAL EXAM  Vitals:   11/04/22 0956  BP: 125/85  Pulse: 68  Weight: 157 lb (71.2 kg)  Height: 5\' 2"  (1.575 m)     Body mass index is 28.72 kg/m.  Generalized: Well developed, in no acute distress  Cardiology: normal rate and rhythm, no murmur auscultated  Respiratory: clear to auscultation bilaterally    Neurological examination  Mentation: Alert  oriented to time, place, history taking. Follows all commands speech and language fluent Cranial nerve II-XII: Pupils were equal round reactive to light. Extraocular movements were full, visual field were full on confrontational test. Facial sensation and strength were normal. Head turning and shoulder shrug  were normal and symmetric. Motor: The motor testing reveals 5 over 5 strength of all 4 extremities. Good symmetric motor tone is noted throughout.  Sensory: Sensory testing is intact to soft touch on all 4 extremities. No evidence of extinction is noted.  Coordination: Cerebellar testing reveals good finger-nose-finger and heel-to-shin bilaterally.  Gait and station: Gait is normal.  Reflexes: Deep tendon reflexes are symmetric and normal bilaterally.    DIAGNOSTIC DATA (LABS, IMAGING, TESTING) - I reviewed patient records, labs, notes, testing and imaging myself where available.  Lab Results  Component Value Date   WBC 6.2 07/19/2022   HGB 13.4 07/19/2022   HCT 41.7 07/19/2022   MCV 83.4 07/19/2022   PLT 349 07/19/2022      Component Value Date/Time   NA 137 07/19/2022 1907   K 3.3 (L) 07/19/2022 1907   CL 99 07/19/2022 1907   CO2 27 07/19/2022 1907   GLUCOSE 93 07/19/2022 1907   BUN 12 07/19/2022 1907   CREATININE 0.86 07/19/2022 1907   CALCIUM 9.3 07/19/2022 1907   GFRNONAA >60 07/19/2022 1907   GFRAA >60 04/13/2018 2048   No results found for: "CHOL", "HDL", "LDLCALC", "LDLDIRECT", "TRIG", "CHOLHDL" No results found for: "HGBA1C" No results found for: "VITAMINB12" Lab Results  Component Value Date   TSH 1.271 06/10/2022        No data to display               No data to display           ASSESSMENT AND PLAN  37 y.o. year old female  has a past medical history of Headache, Multiple sclerosis (HCC), and Vision abnormalities. here with    Relapsing remitting multiple sclerosis (HCC) - Plan: MR BRAIN W WO CONTRAST, IgG, IgA, IgM, CBC with  Differential/Platelets  Darrian is doing well, today. We will continue Ocrevus infusions every 6 months. Next infusion scheduled 11/25/2022. I will update labs, today. MRI brian, cervical and thoracic spine performed at [facility] and stable 06/2021. Will send to Dr Epimenio Foot for review. Orders placed for monitoring, today.  She will continue healthy lifestyle habits and regular follow up with PCP. She will return to see Dr Epimenio Foot in 6 months.    Orders Placed This Encounter  Procedures   MR BRAIN W WO CONTRAST    Standing Status:   Future    Standing Expiration Date:   10/30/2023    Order Specific Question:   If indicated for the ordered procedure, I authorize the administration of contrast media per Radiology protocol    Answer:   Yes    Order Specific Question:   What is the patient's sedation requirement?    Answer:   No Sedation    Order Specific Question:   Does the patient have a pacemaker or implanted devices?    Answer:   No    Order Specific Question:   Radiology Contrast Protocol - do NOT remove file path    Answer:   [email or web address]    Order Specific Question:   Preferred imaging location?    Answer:   Internal   IgG, IgA, IgM   CBC with Differential/Platelets     No orders of the defined types were placed in this encounter.     Shawnie Dapper, MSN, FNP-C 11/04/2022, 10:43 AM  [facility] [address] [place], [place] [address] [phone]

## 2022-10-30 NOTE — Patient Instructions (Signed)
Below is our plan:  We will continue Ocrevus infusions. I will update labs today and order MRI for monitoring.   Please make sure you are staying well hydrated. I recommend 50-60 ounces daily. Well balanced diet and regular exercise encouraged. Consistent sleep schedule with 6-8 hours recommended.   Please continue follow up with care team as directed.   Follow up with Dr Epimenio Foot in 6 months   You may receive a survey regarding today's visit. I encourage you to leave honest feed back as I do use this information to improve patient care. Thank you for seeing me today!

## 2022-11-04 ENCOUNTER — Ambulatory Visit: Payer: BC Managed Care – PPO | Admitting: Family Medicine

## 2022-11-04 ENCOUNTER — Encounter: Payer: Self-pay | Admitting: Family Medicine

## 2022-11-04 VITALS — BP 125/85 | HR 68 | Ht 62.0 in | Wt 157.0 lb

## 2022-11-04 DIAGNOSIS — G35 Multiple sclerosis: Secondary | ICD-10-CM | POA: Diagnosis not present

## 2022-11-05 ENCOUNTER — Telehealth: Payer: Self-pay | Admitting: *Deleted

## 2022-11-05 NOTE — Telephone Encounter (Signed)
Sent my chart message re: labs results normal.

## 2022-11-06 DIAGNOSIS — N898 Other specified noninflammatory disorders of vagina: Secondary | ICD-10-CM | POA: Diagnosis not present

## 2022-11-06 NOTE — Telephone Encounter (Signed)
Mailed normal lab results

## 2022-11-07 DIAGNOSIS — F411 Generalized anxiety disorder: Secondary | ICD-10-CM | POA: Diagnosis not present

## 2022-11-11 ENCOUNTER — Ambulatory Visit: Payer: BC Managed Care – PPO | Attending: Cardiology

## 2022-11-17 DIAGNOSIS — N83299 Other ovarian cyst, unspecified side: Secondary | ICD-10-CM | POA: Diagnosis not present

## 2022-11-17 DIAGNOSIS — R102 Pelvic and perineal pain: Secondary | ICD-10-CM | POA: Diagnosis not present

## 2022-11-17 DIAGNOSIS — B379 Candidiasis, unspecified: Secondary | ICD-10-CM | POA: Diagnosis not present

## 2022-11-17 DIAGNOSIS — D259 Leiomyoma of uterus, unspecified: Secondary | ICD-10-CM | POA: Diagnosis not present

## 2022-11-17 DIAGNOSIS — N915 Oligomenorrhea, unspecified: Secondary | ICD-10-CM | POA: Diagnosis not present

## 2022-11-18 ENCOUNTER — Ambulatory Visit (INDEPENDENT_AMBULATORY_CARE_PROVIDER_SITE_OTHER): Payer: BC Managed Care – PPO

## 2022-11-18 DIAGNOSIS — G35 Multiple sclerosis: Secondary | ICD-10-CM

## 2022-11-18 MED ORDER — GADOBENATE DIMEGLUMINE 529 MG/ML IV SOLN
14.0000 mL | Freq: Once | INTRAVENOUS | Status: AC | PRN
Start: 2022-11-18 — End: 2022-11-18
  Administered 2022-11-18: 14 mL via INTRAVENOUS

## 2022-11-20 DIAGNOSIS — F411 Generalized anxiety disorder: Secondary | ICD-10-CM | POA: Diagnosis not present

## 2022-11-25 ENCOUNTER — Other Ambulatory Visit: Payer: Self-pay | Admitting: Cardiology

## 2022-11-25 DIAGNOSIS — R072 Precordial pain: Secondary | ICD-10-CM

## 2022-11-26 DIAGNOSIS — F411 Generalized anxiety disorder: Secondary | ICD-10-CM | POA: Diagnosis not present

## 2022-11-27 DIAGNOSIS — G35 Multiple sclerosis: Secondary | ICD-10-CM | POA: Diagnosis not present

## 2022-12-03 DIAGNOSIS — F411 Generalized anxiety disorder: Secondary | ICD-10-CM | POA: Diagnosis not present

## 2022-12-05 ENCOUNTER — Encounter: Payer: Self-pay | Admitting: Family Medicine

## 2022-12-05 DIAGNOSIS — J988 Other specified respiratory disorders: Secondary | ICD-10-CM | POA: Diagnosis not present

## 2022-12-05 DIAGNOSIS — D849 Immunodeficiency, unspecified: Secondary | ICD-10-CM | POA: Diagnosis not present

## 2022-12-10 DIAGNOSIS — F411 Generalized anxiety disorder: Secondary | ICD-10-CM | POA: Diagnosis not present

## 2022-12-16 ENCOUNTER — Ambulatory Visit: Payer: BC Managed Care – PPO | Attending: Cardiology

## 2022-12-16 DIAGNOSIS — R072 Precordial pain: Secondary | ICD-10-CM

## 2022-12-17 DIAGNOSIS — F411 Generalized anxiety disorder: Secondary | ICD-10-CM | POA: Diagnosis not present

## 2022-12-17 LAB — EXERCISE TOLERANCE TEST
Angina Index: 0
Base ST Depression (mm): 0 mm
Duke Treadmill Score: 10
Estimated workload: 11.7
Exercise duration (min): 10 min
Exercise duration (sec): 0 s
MPHR: 183 {beats}/min
Peak HR: 181 {beats}/min
Percent HR: 98 %
RPE: 16
Rest HR: 87 {beats}/min
ST Depression (mm): 0 mm

## 2023-01-02 DIAGNOSIS — F411 Generalized anxiety disorder: Secondary | ICD-10-CM | POA: Diagnosis not present

## 2023-01-09 DIAGNOSIS — F411 Generalized anxiety disorder: Secondary | ICD-10-CM | POA: Diagnosis not present

## 2023-01-20 DIAGNOSIS — F411 Generalized anxiety disorder: Secondary | ICD-10-CM | POA: Diagnosis not present

## 2023-01-29 DIAGNOSIS — F411 Generalized anxiety disorder: Secondary | ICD-10-CM | POA: Diagnosis not present

## 2023-02-13 ENCOUNTER — Other Ambulatory Visit: Payer: Self-pay | Admitting: Obstetrics and Gynecology

## 2023-02-13 ENCOUNTER — Other Ambulatory Visit (HOSPITAL_COMMUNITY)
Admission: RE | Admit: 2023-02-13 | Discharge: 2023-02-13 | Disposition: A | Discharge status: Home / Self Care | Payer: BC Managed Care – PPO | Source: Ambulatory Visit | Attending: Obstetrics and Gynecology | Admitting: Obstetrics and Gynecology

## 2023-02-13 DIAGNOSIS — Z01419 Encounter for gynecological examination (general) (routine) without abnormal findings: Secondary | ICD-10-CM | POA: Insufficient documentation

## 2023-02-13 DIAGNOSIS — N83209 Unspecified ovarian cyst, unspecified side: Secondary | ICD-10-CM | POA: Diagnosis not present

## 2023-02-16 LAB — CYTOLOGY - PAP
Comment: NEGATIVE
Diagnosis: NEGATIVE
High risk HPV: NEGATIVE

## 2023-02-20 DIAGNOSIS — Z23 Encounter for immunization: Secondary | ICD-10-CM | POA: Diagnosis not present

## 2023-03-05 DIAGNOSIS — F411 Generalized anxiety disorder: Secondary | ICD-10-CM | POA: Diagnosis not present

## 2023-03-09 DIAGNOSIS — N390 Urinary tract infection, site not specified: Secondary | ICD-10-CM | POA: Diagnosis not present

## 2023-03-09 DIAGNOSIS — R399 Unspecified symptoms and signs involving the genitourinary system: Secondary | ICD-10-CM | POA: Diagnosis not present

## 2023-03-12 DIAGNOSIS — F411 Generalized anxiety disorder: Secondary | ICD-10-CM | POA: Diagnosis not present

## 2023-03-20 DIAGNOSIS — F411 Generalized anxiety disorder: Secondary | ICD-10-CM | POA: Diagnosis not present

## 2023-03-27 DIAGNOSIS — F411 Generalized anxiety disorder: Secondary | ICD-10-CM | POA: Diagnosis not present

## 2023-04-02 DIAGNOSIS — F411 Generalized anxiety disorder: Secondary | ICD-10-CM | POA: Diagnosis not present

## 2023-04-30 DIAGNOSIS — F411 Generalized anxiety disorder: Secondary | ICD-10-CM | POA: Diagnosis not present

## 2023-05-05 DIAGNOSIS — F411 Generalized anxiety disorder: Secondary | ICD-10-CM | POA: Diagnosis not present

## 2023-05-11 DIAGNOSIS — Z309 Encounter for contraceptive management, unspecified: Secondary | ICD-10-CM | POA: Diagnosis not present

## 2023-05-11 DIAGNOSIS — N83201 Unspecified ovarian cyst, right side: Secondary | ICD-10-CM | POA: Diagnosis not present

## 2023-05-11 DIAGNOSIS — N83202 Unspecified ovarian cyst, left side: Secondary | ICD-10-CM | POA: Diagnosis not present

## 2023-05-11 DIAGNOSIS — R102 Pelvic and perineal pain: Secondary | ICD-10-CM | POA: Diagnosis not present

## 2023-05-13 ENCOUNTER — Ambulatory Visit: Payer: BC Managed Care – PPO | Admitting: Neurology

## 2023-05-13 ENCOUNTER — Encounter: Payer: Self-pay | Admitting: Neurology

## 2023-05-13 VITALS — BP 140/92 | HR 78 | Ht 62.0 in | Wt 158.0 lb

## 2023-05-13 DIAGNOSIS — F09 Unspecified mental disorder due to known physiological condition: Secondary | ICD-10-CM

## 2023-05-13 DIAGNOSIS — G35 Multiple sclerosis: Secondary | ICD-10-CM | POA: Diagnosis not present

## 2023-05-13 DIAGNOSIS — Z79899 Other long term (current) drug therapy: Secondary | ICD-10-CM

## 2023-05-13 DIAGNOSIS — R5383 Other fatigue: Secondary | ICD-10-CM | POA: Diagnosis not present

## 2023-05-13 DIAGNOSIS — R7989 Other specified abnormal findings of blood chemistry: Secondary | ICD-10-CM | POA: Diagnosis not present

## 2023-05-13 NOTE — Progress Notes (Signed)
 [facility]  PATIENT: Marissa Andersen DOB: 23-Jun-1985  REFERRING DOCTOR OR PCP:  Dr. Sherlyn Lick SOURCE: Patient, notes from Dr. Roxanna Mew, imaging and lab reports, MRI images personally reviewed on PACS.  _________________________________   HISTORICAL  CHIEF COMPLAINT:  Chief Complaint  Patient presents with   Follow-up    Pt in room 10. Alone. Here for MS follow up. DMT: Emogene Morgan. Pt reports doing good. No falls, No concerns.     HISTORY OF PRESENT ILLNESS:  Marissa Andersen is a 38 y.o. woman with relapsing remitting multiple sclerosis.    Update 05/13/2023: She is on Ocrevus as her disease modifying therapy since diagnosis in June 2019.  Her next infusion will be April 2025.  She has generally tolerated it well.    Gait is stable  with a rare stumble but no falls.   She will hold the bannister on stairs going down.  She has mild leg numbness.  No longer notes let leg weakness.  Marland KitchenSpasms are doing better in legs.   She has some urinary urgency at times but no incontinence.  Vision is stable.  She has fatigue some days but better on general than last year.   She felt some depression late last year  but feels better...  Mild anxiety.    Cognition is the same with some reduced focus/attention.  She notes some verbal fluency issues.    We had tried Adderall XR 15 mg but she felt more anxious with it and stopped after only a few pills  Her headaches are doing better than last year.  She waa on Vit D 5000 U daily and we discussed restarting.   MS History:  She was diagnosed with MS in May 2019.    In January 2019, [name] woke up with a severe headache in January that did not improve.    Then she had dizziness and nausea in January and February 2019.    Her smile seemed distorted and she had right facial numbness in February 2019.    Around March, she noted difficulty with vision out of the right eye followed by diplopia. In [place] PCP first started to treat her for  migraine.   When symptoms persisted, she had a head CT which was read as normal (in retrospect, there is one focus on the left).  At some point, she noted increased urinary frequency,  She saw Dr. Roxanna Mew May 2019.   An MRI was ordered in late May 2019 and she was diagnosed with MS based on the pattern and that multiple lesions enhanced.   She did feel clumsy and fall once in June or July.    She received 5 days of IV Solumedrol in late MAy, early June.     She was prescribed Ocrevus and had the split dose 08/13/2017 and 08/28/2017.       IMAGING The MRI of the brain 5/23/20219 shows multiple T2/FLAIR hyperintense foci in the hemispheres as well in the posterior pons.  About 7-8 of the lesions were enhancing after contrast.  The largest focus is in the left parietal lobe.      MRI of the cervical and thoracic spine 07/28/2017 showed foci located at C2, T1, T5 and T11.  None of them and enhanced.   She also had lab work in May and June 2019.  The JCV antibody was positive at 1.56.  The neuromyelitis optica antibody was negative.  MRI of the brain 09/01/2018 showed  Multiple T2/flair hyperintense foci  in the hemispheres and the right cerebral peduncle in a pattern and configuration consistent with chronic demyelinating plaque associated with multiple sclerosis.  None of the foci enhances on the current study.  Compared to the 07/09/2017 MRI, there are no new lesions.  MRI brain 11/18/2022 showed no new lesions.  No FH of MS  REVIEW OF SYSTEMS: Constitutional: No fevers, chills, sweats, or change in appetite.   She has some fatigue. Eyes: No visual changes, double vision, eye pain Ear, nose and throat: No hearing loss, ear pain, nasal congestion, sore throat Cardiovascular: No chest pain, palpitations Respiratory:  No shortness of breath at rest or with exertion.   No wheezes GastrointestinaI: No nausea, vomiting, diarrhea, abdominal pain, fecal incontinence Genitourinary: She has urinary urgency and  frequency.  No incontinence.   Musculoskeletal:  No neck pain, back pain Integumentary: No rash, pruritus, skin lesions Neurological: as above Psychiatric: No depression at this time but has felt a little sad since her diagnosis..  No anxiety Endocrine: No palpitations, diaphoresis, change in appetite, change in weigh or increased thirst Hematologic/Lymphatic:  No anemia, purpura, petechiae. Allergic/Immunologic: No itchy/runny eyes, nasal congestion, recent allergic reactions, rashes  ALLERGIES: Allergies  Allergen Reactions   Adderall [Amphetamine-Dextroamphetamine] Other (See Comments)    Pt feels it caused anxiety attack    HOME MEDICATIONS:  Current Outpatient Medications:    ocrelizumab (OCREVUS) 300 MG/10ML injection, Inject into the vein once., Disp: , Rfl:   PAST MEDICAL HISTORY: Past Medical History:  Diagnosis Date   Headache    Multiple sclerosis (HCC)    Vision abnormalities     PAST SURGICAL HISTORY: Past Surgical History:  Procedure Laterality Date   BREAST LUMPECTOMY Right 12/06/2020   Procedure: RIGHT BREAST LUMPECTOMY;  Surgeon: Abigail Miyamoto, MD;  Location: [facility];  Service: General;  Laterality: Right;   CERVICAL CONE BIOPSY      FAMILY HISTORY: Family History  Problem Relation Age of Onset   Hypertension Mother    Diabetes type II Mother    Multiple myeloma Mother    Heart attack Father    Hypertension Father    Stroke Father    Healthy Sister    Healthy Brother    Diabetes Maternal Grandmother    Diabetes Maternal Grandfather    Hypertension Paternal Grandmother    Dementia Paternal Grandmother     SOCIAL HISTORY:  Social History   Socioeconomic History   Marital status: Married    Spouse name: Gaspar Garbe   Number of children: 2   Years of education: Not on file   Highest education level: Not on file  Occupational History   Not on file  Tobacco Use   Smoking status: Never   Smokeless tobacco: Never  Vaping  Use   Vaping status: Never Used  Substance and Sexual Activity   Alcohol use: Yes    Comment: socially   Drug use: Never   Sexual activity: Yes    Birth control/protection: None  Other Topics Concern   Not on file  Social History Narrative   ** Merged History Encounter **       Social Drivers of Health   Financial Resource Strain: Low Risk  (06/28/2021)   Received from [employer], [facility]   Overall Financial Resource Strain (CARDIA)    Difficulty of Paying Living Expenses: Not hard at all  Food Insecurity: No Food Insecurity (06/28/2021)   Received from [facility], [facility]   Hunger Vital Sign  Worried About Programme researcher, broadcasting/film/video in the Last Year: Never true    Ran Out of Food in the Last Year: Never true  Transportation Needs: Not on file  Physical Activity: Insufficiently Active (06/28/2021)   Received from [facility], [facility]   Exercise Vital Sign    Days of Exercise per Week: 3 days    Minutes of Exercise per Session: 30 min  Stress: Stress Concern Present (06/28/2021)   Received from [facility], [facility]   [employer] of Occupational Health - Occupational Stress Questionnaire    Feeling of Stress : To some extent  Social Connections: Unknown (06/30/2022)   Received from [facility], [facility]   Social Network    Social Network: Not on file  Intimate Partner Violence: Unknown (05/22/2021)   Received from [facility], [facility]   HITS    Physically Hurt: Not on file    Insult or Talk Down To: Not on file    Threaten Physical Harm: Not on file    Scream or Curse: Not on file     PHYSICAL EXAM  Vitals:   05/13/23 1051 05/13/23 1053  BP: (!) 161/95 (!) 140/92  Pulse: 78   Weight: 158 lb (71.7 kg)   Height: 5\' 2"  (1.575 m)     Body mass index is 28.9 kg/m.   General: The patient is well-developed and well-nourished and in no acute distress  Skin: Extremities are without rash or edema.  Musculoskeletal:   Back is nontender.  Neck has good ROM but is tender on the left over splenius capitus muscle.   Neurologic Exam  Mental status: The patient is alert and oriented x 3 at the time of the examination. The patient has apparent normal recent and remote memory, with an apparently normal attention span and concentration ability.   Speech is normal.  Cranial nerves: Extraocular movements are full.  .  Facial symmetry is present. There is good facial sensation to soft touch bilaterally.Facial strength is normal.  Trapezius and sternocleidomastoid strength is normal. No dysarthria is noted.    No obvious hearing deficits are noted.  Motor:  Muscle bulk is normal.   Tone is normal. Strength is  5 / 5 in all 4 extremities.   Sensory: Sensory testing is intact to pinprick, soft touch and vibration sensation in arms .   She has reduced vibration and touch in left leg relative to the right  Coordination: Cerebellar testing reveals good finger-nose-finger and heel-to-shin bilaterally.  Gait and station: Station is normal.   The gait is normal.  Tandem gait is mildly wide.  Romberg is negative.   Reflexes: Deep tendon reflexes are symmetric and normal bilaterally.     DIAGNOSTIC DATA (LABS, IMAGING, TESTING) - I reviewed patient records, labs, notes, testing and imaging myself where available.  Lab Results  Component Value Date   WBC 4.2 11/04/2022   HGB 12.8 11/04/2022   HCT 40.4 11/04/2022   MCV 86 11/04/2022   PLT 312 11/04/2022      ASSESSMENT AND PLAN    1. Multiple sclerosis (HCC)   2. High risk medication use   3. Low vitamin D level   4. Other fatigue   5. Cognitive deficit secondary to multiple sclerosis (HCC)        1.   Continue Ocrevus.  Check labs.   We will check an MRI of the brain and cervical spine to determine if there is any subclinical progression and consider a  different disease modifying therapy if this is occurring.  Labs fine last visit and wlll recheck next  visit.  2.   Could consider a lower dose of Adderall or Ritalin (i.e. Adderall XR 10 mg daily) 3.   Continue vit D and supplement  5.    She will return in 6 months for regular visit.     Tannon Peerson A. Epimenio Foot, MD, PhD, FAAN Certified in Neurology, Clinical Neurophysiology, Sleep Medicine, Pain Medicine and Neuroimaging Director, Multiple Sclerosis Center at [facility]  [facility] [address], [address]  [place], [place] [address] [phone]

## 2023-05-14 DIAGNOSIS — F411 Generalized anxiety disorder: Secondary | ICD-10-CM | POA: Diagnosis not present

## 2023-05-16 LAB — IGG, IGA, IGM
IgA/Immunoglobulin A, Serum: 90 mg/dL (ref 87–352)
IgG (Immunoglobin G), Serum: 1271 mg/dL (ref 586–1602)
IgM (Immunoglobulin M), Srm: 26 mg/dL (ref 26–217)

## 2023-05-16 LAB — VITAMIN D 25 HYDROXY (VIT D DEFICIENCY, FRACTURES): Vit D, 25-Hydroxy: 36.8 ng/mL (ref 30.0–100.0)

## 2023-05-16 LAB — CD20 B CELLS
% CD19-B Cells: 0 % — ABNORMAL LOW (ref 4.6–22.1)
% CD20-B Cells: 0 % — ABNORMAL LOW (ref 5.0–22.3)

## 2023-05-17 ENCOUNTER — Encounter: Payer: Self-pay | Admitting: Neurology

## 2023-05-26 DIAGNOSIS — F411 Generalized anxiety disorder: Secondary | ICD-10-CM | POA: Diagnosis not present

## 2023-06-10 NOTE — Telephone Encounter (Signed)
 Per [facility]:

## 2023-06-16 DIAGNOSIS — G35 Multiple sclerosis: Secondary | ICD-10-CM | POA: Diagnosis not present

## 2023-06-16 NOTE — Telephone Encounter (Signed)
 Confirmed pt had Ocrevus  infusion today w/ [facility]. Referral/order faxed to [facility] for next infusion due 11/2023 at [phone]. Received fax confirmation.

## 2023-06-19 DIAGNOSIS — F411 Generalized anxiety disorder: Secondary | ICD-10-CM | POA: Diagnosis not present

## 2023-06-22 DIAGNOSIS — L659 Nonscarring hair loss, unspecified: Secondary | ICD-10-CM | POA: Diagnosis not present

## 2023-06-22 DIAGNOSIS — R5383 Other fatigue: Secondary | ICD-10-CM | POA: Diagnosis not present

## 2023-06-23 NOTE — Telephone Encounter (Signed)
 Called [facility] at [phone] to check on status of referral. Spoke w/ Mya. As of yesterday, working on getting approval via insurance for infusion due around 12/16/23.

## 2023-06-26 DIAGNOSIS — F411 Generalized anxiety disorder: Secondary | ICD-10-CM | POA: Diagnosis not present

## 2023-07-02 DIAGNOSIS — F411 Generalized anxiety disorder: Secondary | ICD-10-CM | POA: Diagnosis not present

## 2023-07-07 DIAGNOSIS — F411 Generalized anxiety disorder: Secondary | ICD-10-CM | POA: Diagnosis not present

## 2023-07-07 NOTE — Telephone Encounter (Signed)
 Called [facility] at [phone] to check on status of referral. Spoke w/ Felicity. Still working on Web designer via insurance for infusion due around 12/16/23. She will have rep call our office with update on things.

## 2023-07-09 NOTE — Telephone Encounter (Signed)
 Spoke w/ [facility]. They submitted second auth request 07/08/23. Waiting on determination from pt insurance.

## 2023-07-16 ENCOUNTER — Telehealth: Payer: Self-pay | Admitting: Family Medicine

## 2023-07-16 NOTE — Telephone Encounter (Signed)
 Called [facility] and spoke w/ [employer]. PA still pending. Transferred me to case Production designer, theatre/television/film. She spoke w/ [employer] 07/14/23 and re-submitted things because they were told they did not get original fax. She will call them today to get update and call our office back.

## 2023-07-16 NOTE — Telephone Encounter (Signed)
 Megan from [facility] called requesting to speak to [facility] about Pt.  Marissa Andersen wants a callback   Callback number [phone] ext.[phone]

## 2023-07-16 NOTE — Telephone Encounter (Signed)
   I called back and spoke w/ Megan. She spoke w/ [employer] and PA approved 07/08/23-07/07/24. They are waiting on approval letter to be sent to them.  They called pt 07/15/23 and LVM to do welcome call.

## 2023-07-16 NOTE — Telephone Encounter (Signed)
 See mychart encounter from 05/17/23

## 2023-07-20 DIAGNOSIS — G35 Multiple sclerosis: Secondary | ICD-10-CM | POA: Diagnosis not present

## 2023-07-20 DIAGNOSIS — R300 Dysuria: Secondary | ICD-10-CM | POA: Diagnosis not present

## 2023-07-21 DIAGNOSIS — F411 Generalized anxiety disorder: Secondary | ICD-10-CM | POA: Diagnosis not present

## 2023-07-28 DIAGNOSIS — F411 Generalized anxiety disorder: Secondary | ICD-10-CM | POA: Diagnosis not present

## 2023-08-03 NOTE — Telephone Encounter (Signed)
 Received fax from [facility] that pt declined services. I called pt. She decided to stay with [facility] d/t cost issues with [facility]. I sent message to [facility] letting them know she will be staying with them for infusions.

## 2023-08-07 DIAGNOSIS — F411 Generalized anxiety disorder: Secondary | ICD-10-CM | POA: Diagnosis not present

## 2023-08-13 DIAGNOSIS — F411 Generalized anxiety disorder: Secondary | ICD-10-CM | POA: Diagnosis not present

## 2023-08-25 DIAGNOSIS — F411 Generalized anxiety disorder: Secondary | ICD-10-CM | POA: Diagnosis not present

## 2023-09-01 DIAGNOSIS — F411 Generalized anxiety disorder: Secondary | ICD-10-CM | POA: Diagnosis not present

## 2023-09-10 DIAGNOSIS — F411 Generalized anxiety disorder: Secondary | ICD-10-CM | POA: Diagnosis not present

## 2023-09-15 DIAGNOSIS — N83201 Unspecified ovarian cyst, right side: Secondary | ICD-10-CM | POA: Diagnosis not present

## 2023-09-15 DIAGNOSIS — N83299 Other ovarian cyst, unspecified side: Secondary | ICD-10-CM | POA: Diagnosis not present

## 2023-09-15 DIAGNOSIS — N83202 Unspecified ovarian cyst, left side: Secondary | ICD-10-CM | POA: Diagnosis not present

## 2023-09-15 DIAGNOSIS — D219 Benign neoplasm of connective and other soft tissue, unspecified: Secondary | ICD-10-CM | POA: Diagnosis not present

## 2023-09-17 DIAGNOSIS — F411 Generalized anxiety disorder: Secondary | ICD-10-CM | POA: Diagnosis not present

## 2023-09-24 DIAGNOSIS — F411 Generalized anxiety disorder: Secondary | ICD-10-CM | POA: Diagnosis not present

## 2023-09-29 DIAGNOSIS — F411 Generalized anxiety disorder: Secondary | ICD-10-CM | POA: Diagnosis not present

## 2023-10-09 DIAGNOSIS — F411 Generalized anxiety disorder: Secondary | ICD-10-CM | POA: Diagnosis not present

## 2023-10-16 DIAGNOSIS — F411 Generalized anxiety disorder: Secondary | ICD-10-CM | POA: Diagnosis not present

## 2023-10-21 ENCOUNTER — Ambulatory Visit (INDEPENDENT_AMBULATORY_CARE_PROVIDER_SITE_OTHER): Admitting: Neurology

## 2023-10-21 ENCOUNTER — Encounter: Payer: Self-pay | Admitting: Neurology

## 2023-10-21 VITALS — BP 129/87 | HR 73 | Ht 62.0 in | Wt 155.5 lb

## 2023-10-21 DIAGNOSIS — M542 Cervicalgia: Secondary | ICD-10-CM

## 2023-10-21 DIAGNOSIS — R5383 Other fatigue: Secondary | ICD-10-CM

## 2023-10-21 DIAGNOSIS — G35D Multiple sclerosis, unspecified: Secondary | ICD-10-CM

## 2023-10-21 DIAGNOSIS — G35 Multiple sclerosis: Secondary | ICD-10-CM | POA: Diagnosis not present

## 2023-10-21 DIAGNOSIS — G35A Relapsing-remitting multiple sclerosis: Secondary | ICD-10-CM

## 2023-10-21 DIAGNOSIS — G4452 New daily persistent headache (NDPH): Secondary | ICD-10-CM | POA: Diagnosis not present

## 2023-10-21 DIAGNOSIS — G44201 Tension-type headache, unspecified, intractable: Secondary | ICD-10-CM

## 2023-10-21 DIAGNOSIS — R7989 Other specified abnormal findings of blood chemistry: Secondary | ICD-10-CM | POA: Diagnosis not present

## 2023-10-21 DIAGNOSIS — N83299 Other ovarian cyst, unspecified side: Secondary | ICD-10-CM | POA: Diagnosis not present

## 2023-10-21 DIAGNOSIS — Z79899 Other long term (current) drug therapy: Secondary | ICD-10-CM

## 2023-10-21 MED ORDER — INDOMETHACIN 25 MG PO CAPS: 25.0000 mg | ORAL_CAPSULE | Freq: Three times a day (TID) | ORAL | 1 refills | Status: AC | PRN

## 2023-10-21 MED ORDER — BETAMETHASONE SOD PHOS & ACET 6 (3-3) MG/ML IJ SUSP
30.0000 mg | Freq: Once | INTRAMUSCULAR | Status: AC
  Administered 2023-10-21: 12 mg via INTRAMUSCULAR

## 2023-10-21 NOTE — Progress Notes (Signed)
 [facility]  PATIENT: Marissa Andersen DOB: 1985-10-17  REFERRING DOCTOR OR PCP:  Dr. Alvera SOURCE: Patient, notes from Dr. Romualdo, imaging and lab reports, MRI images personally reviewed on PACS.  _________________________________   HISTORICAL  CHIEF COMPLAINT:  Chief Complaint  Patient presents with   Follow-up    Pt in room 11. Here for worsening headaches that started Monday night and has been daily since then. Has tired ibuprofen helped a little.  DMT: Ocrevus  Last infusion date: 06/16/2023 Next infusion date: 12/12/2023      HISTORY OF PRESENT ILLNESS:  Marissa Andersen is a 38 y.o. woman with relapsing remitting multiple sclerosis.    Update 10/21/2023: She has had more headaches.   She had a severe left sided HA more recently last few days.   Initially left head only - now left occiput > left scalp > left neck.     She rarely gets headaches since having many in 2019/2020.    In the past, indomethacin  helped more than OTC med's.   She denies visual change.   No new numbness or weakness.    Her balance is unchanged.  She is on Ocrevus  as her disease modifying therapy since diagnosis in June 2019.  Her next infusion will be October 2025.  She has generally tolerated it well.    Gait is stable  with a rare stumble but no falls.   She will hold the bannister on stairs going down.  She has mild leg numbness.  No longer notes let leg weakness.  SABRASpasms are doing better in legs.   She has some urinary urgency at times but no incontinence.  Vision is stable.  She has fatigue some days but better on general than last year.   She felt some depression late last year  but feels better...  Mild anxiety.    Cognition is the same with some reduced focus/attention.  She notes some verbal fluency issues.    We had tried Adderall XR 15 mg but she felt more anxious with it and stopped after only a few pills  Her headaches are doing better than last year.  She waa on  Vit D 5000 U daily and we discussed restarting.   MS History:  She was diagnosed with MS in May 2019.    In January 2019, [name] woke up with a severe headache in January that did not improve.    Then she had dizziness and nausea in January and February 2019.    Her smile seemed distorted and she had right facial numbness in February 2019.    Around March, she noted difficulty with vision out of the right eye followed by diplopia. In [place] PCP first started to treat her for migraine.   When symptoms persisted, she had a head CT which was read as normal (in retrospect, there is one focus on the left).  At some point, she noted increased urinary frequency,  She saw Dr. Romualdo May 2019.   An MRI was ordered in late May 2019 and she was diagnosed with MS based on the pattern and that multiple lesions enhanced.   She did feel clumsy and fall once in June or July.    She received 5 days of IV Solumedrol in late [place]y, early June.     She was prescribed Ocrevus  and had the split dose 08/13/2017 and 08/28/2017.       IMAGING The MRI of the brain 5/23/20219 shows multiple T2/FLAIR hyperintense  foci in the hemispheres as well in the posterior pons.  About 7-8 of the lesions were enhancing after contrast.  The largest focus is in the left parietal lobe.      MRI of the cervical and thoracic spine 07/28/2017 showed foci located at C2, T1, T5 and T11.  None of them and enhanced.   She also had lab work in May and June 2019.  The JCV antibody was positive at 1.56.  The neuromyelitis optica antibody was negative.  MRI of the brain 09/01/2018 showed  Multiple T2/flair hyperintense foci in the hemispheres and the right cerebral peduncle in a pattern and configuration consistent with chronic demyelinating plaque associated with multiple sclerosis.  None of the foci enhances on the current study.  Compared to the 07/09/2017 MRI, there are no new lesions.  MRI brain 11/18/2022 showed no new lesions.  No FH of MS  REVIEW  OF SYSTEMS: Constitutional: No fevers, chills, sweats, or change in appetite.   She has some fatigue. Eyes: No visual changes, double vision, eye pain Ear, nose and throat: No hearing loss, ear pain, nasal congestion, sore throat Cardiovascular: No chest pain, palpitations Respiratory:  No shortness of breath at rest or with exertion.   No wheezes GastrointestinaI: No nausea, vomiting, diarrhea, abdominal pain, fecal incontinence Genitourinary: She has urinary urgency and frequency.  No incontinence.   Musculoskeletal:  No neck pain, back pain Integumentary: No rash, pruritus, skin lesions Neurological: as above Psychiatric: No depression at this time but has felt a little sad since her diagnosis..  No anxiety Endocrine: No palpitations, diaphoresis, change in appetite, change in weigh or increased thirst Hematologic/Lymphatic:  No anemia, purpura, petechiae. Allergic/Immunologic: No itchy/runny eyes, nasal congestion, recent allergic reactions, rashes  ALLERGIES: Allergies  Allergen Reactions   Adderall [Amphetamine -Dextroamphetamine] Other (See Comments)    Pt feels it caused anxiety attack    HOME MEDICATIONS:  Current Outpatient Medications:    indomethacin  (INDOCIN ) 25 MG capsule, Take 1 capsule (25 mg total) by mouth 3 (three) times daily as needed. Take with food, Disp: 30 capsule, Rfl: 1   ocrelizumab  (OCREVUS ) 300 MG/10ML injection, Inject into the vein once., Disp: , Rfl:   PAST MEDICAL HISTORY: Past Medical History:  Diagnosis Date   Headache    Multiple sclerosis (HCC)    Vision abnormalities     PAST SURGICAL HISTORY: Past Surgical History:  Procedure Laterality Date   BREAST LUMPECTOMY Right 12/06/2020   Procedure: RIGHT BREAST LUMPECTOMY;  Surgeon: Vernetta Berg, MD;  Location: [facility];  Service: General;  Laterality: Right;   CERVICAL CONE BIOPSY      FAMILY HISTORY: Family History  Problem Relation Age of Onset   Hypertension  Mother    Diabetes type II Mother    Multiple myeloma Mother    Heart attack Father    Hypertension Father    Stroke Father    Healthy Sister    Healthy Brother    Diabetes Maternal Grandmother    Diabetes Maternal Grandfather    Hypertension Paternal Grandmother    Dementia Paternal Grandmother     SOCIAL HISTORY:  Social History   Socioeconomic History   Marital status: Married    Spouse name: Sim   Number of children: 2   Years of education: Not on file   Highest education level: Not on file  Occupational History   Not on file  Tobacco Use   Smoking status: Never   Smokeless tobacco: Never  Vaping Use  Vaping status: Never Used  Substance and Sexual Activity   Alcohol use: Yes    Comment: socially   Drug use: Never   Sexual activity: Yes    Birth control/protection: None  Other Topics Concern   Not on file  Social History Narrative   ** Merged History Encounter **       Social Drivers of Health   Financial Resource Strain: Low Risk  (06/28/2021)   Received from [facility]   Overall Financial Resource Strain (CARDIA)    Difficulty of Paying Living Expenses: Not hard at all  Food Insecurity: No Food Insecurity (06/28/2021)   Received from [facility]   Hunger Vital Sign    Within the past 12 months, you worried that your food would run out before you got the money to buy more.: Never true    Within the past 12 months, the food you bought just didn't last and you didn't have money to get more.: Never true  Transportation Needs: Not on file  Physical Activity: Insufficiently Active (06/28/2021)   Received from [facility]   Exercise Vital Sign    On average, how many days per week do you engage in moderate to strenuous exercise (like a brisk walk)?: 3 days    On average, how many minutes do you engage in exercise at this level?: 30 min  Stress: Stress Concern Present (06/28/2021)   Received from [facility] Occupational  Health - Occupational Stress Questionnaire    Feeling of Stress : To some extent  Social Connections: Unknown (06/30/2022)   Received from [facility]   Social Network    Social Network: Not on file  Intimate Partner Violence: Unknown (05/22/2021)   Received from [facility]   HITS    Physically Hurt: Not on file    Insult or Talk Down To: Not on file    Threaten Physical Harm: Not on file    Scream or Curse: Not on file     PHYSICAL EXAM  Vitals:   10/21/23 1110  BP: 129/87  Pulse: 73  Weight: 155 lb 8 oz (70.5 kg)  Height: 5' 2 (1.575 m)    Body mass index is 28.44 kg/m.   General: The patient is well-developed and well-nourished and in no acute distress  Skin: Extremities are without rash or edema.  Musculoskeletal:  Back is nontender.  Neck has good ROM.  She is tender on the left over splenius capitus muscle.   Neurologic Exam  Mental status: The patient is alert and oriented x 3 at the time of the examination. The patient has apparent normal recent and remote memory, with an apparently normal attention span and concentration ability.   Speech is normal.  Cranial nerves: Extraocular movements are full.  .  Facial symmetry is present. There is good facial sensation to soft touch bilaterally.Facial strength is normal.  Trapezius and sternocleidomastoid strength is normal. No dysarthria is noted.    No obvious hearing deficits are noted.  Motor:  Muscle bulk is normal.   Tone is normal. Strength is  5 / 5 in all 4 extremities.   Sensory: Sensory testing is intact to pinprick, soft touch and vibration sensation in arms .   She has reduced vibration and touch in left leg relative to the right  Coordination: Cerebellar testing reveals good finger-nose-finger and heel-to-shin bilaterally.  Gait and station: Station is normal.   The gait is normal.  Tandem gait is mildly wide.  Romberg is negative.   Reflexes: Deep tendon reflexes are symmetric and normal  bilaterally.     DIAGNOSTIC DATA (LABS, IMAGING, TESTING) - I reviewed patient records, labs, notes, testing and imaging myself where available.  Lab Results  Component Value Date   WBC 4.2 11/04/2022   HGB 12.8 11/04/2022   HCT 40.4 11/04/2022   MCV 86 11/04/2022   PLT 312 11/04/2022      ASSESSMENT AND PLAN    1. Multiple sclerosis (HCC)   2. High risk medication use   3. Other fatigue   4. Low vitamin D  level   5. Neck pain   6. New daily persistent headache     1.   Continue Ocrevus .  Check labs.   2.   If fatigue or focus worsen, could consider a lower dose of Adderall or Ritalin (i.e. Adderall XR 10 mg daily) 3.   Continue vit D supplement  4.   TPI left splenius capitus muscle with 12 mg Betamethasone  in 3 cc Marcaine  using sterile technique.  She tolerated the injection well.  No complications and pain improved in several minutes.     5. She will return in 6 months for regular visit.   Or sooner if new or worsening issues  Blair Lundeen A. Vear, MD, PhD, FAAN Certified in Neurology, Clinical Neurophysiology, Sleep Medicine, Pain Medicine and Neuroimaging Director, Multiple Sclerosis Center at [facility]  [facility] [address], [address]  [place], [place] [address] [phone]

## 2023-10-21 NOTE — Telephone Encounter (Signed)
 Pt checked in at 11:03a. Dr. Vear agreed to see pt.

## 2023-10-22 ENCOUNTER — Other Ambulatory Visit: Payer: Self-pay | Admitting: Neurology

## 2023-10-22 ENCOUNTER — Ambulatory Visit: Payer: Self-pay | Admitting: Neurology

## 2023-10-22 DIAGNOSIS — G35 Multiple sclerosis: Secondary | ICD-10-CM

## 2023-10-22 DIAGNOSIS — G4452 New daily persistent headache (NDPH): Secondary | ICD-10-CM

## 2023-10-22 DIAGNOSIS — R269 Unspecified abnormalities of gait and mobility: Secondary | ICD-10-CM

## 2023-10-22 LAB — CBC WITH DIFFERENTIAL/PLATELET
Basophils Absolute: 0 x10E3/uL (ref 0.0–0.2)
Basos: 1 %
EOS (ABSOLUTE): 0.1 x10E3/uL (ref 0.0–0.4)
Eos: 2 %
Hematocrit: 42.8 % (ref 34.0–46.6)
Hemoglobin: 13.5 g/dL (ref 11.1–15.9)
Immature Grans (Abs): 0 x10E3/uL (ref 0.0–0.1)
Immature Granulocytes: 0 %
Lymphocytes Absolute: 1.4 x10E3/uL (ref 0.7–3.1)
Lymphs: 25 %
MCH: 27.4 pg (ref 26.6–33.0)
MCHC: 31.5 g/dL (ref 31.5–35.7)
MCV: 87 fL (ref 79–97)
Monocytes Absolute: 0.4 x10E3/uL (ref 0.1–0.9)
Monocytes: 8 %
Neutrophils Absolute: 3.6 x10E3/uL (ref 1.4–7.0)
Neutrophils: 64 %
Platelets: 319 x10E3/uL (ref 150–450)
RBC: 4.93 x10E6/uL (ref 3.77–5.28)
RDW: 12.5 % (ref 11.7–15.4)
WBC: 5.6 x10E3/uL (ref 3.4–10.8)

## 2023-10-22 LAB — IGG, IGA, IGM
IgA/Immunoglobulin A, Serum: 90 mg/dL (ref 87–352)
IgG (Immunoglobin G), Serum: 1253 mg/dL (ref 586–1602)
IgM (Immunoglobulin M), Srm: 27 mg/dL (ref 26–217)

## 2023-10-23 DIAGNOSIS — F411 Generalized anxiety disorder: Secondary | ICD-10-CM | POA: Diagnosis not present

## 2023-10-26 ENCOUNTER — Other Ambulatory Visit: Payer: Self-pay | Admitting: Neurology

## 2023-10-26 MED ORDER — IMIPRAMINE HCL 25 MG PO TABS: 25.0000 mg | ORAL_TABLET | Freq: Every day | ORAL | 5 refills | Status: AC

## 2023-10-27 ENCOUNTER — Encounter (HOSPITAL_COMMUNITY): Payer: Self-pay | Admitting: Obstetrics and Gynecology

## 2023-10-27 ENCOUNTER — Ambulatory Visit

## 2023-10-27 DIAGNOSIS — G35 Multiple sclerosis: Secondary | ICD-10-CM | POA: Diagnosis not present

## 2023-10-27 DIAGNOSIS — G4452 New daily persistent headache (NDPH): Secondary | ICD-10-CM | POA: Diagnosis not present

## 2023-10-27 DIAGNOSIS — R269 Unspecified abnormalities of gait and mobility: Secondary | ICD-10-CM

## 2023-10-27 MED ORDER — GADOBENATE DIMEGLUMINE 529 MG/ML IV SOLN
14.0000 mL | Freq: Once | INTRAVENOUS | Status: AC | PRN
Start: 2023-10-27 — End: 2023-10-27
  Administered 2023-10-27: 14 mL via INTRAVENOUS

## 2023-10-27 NOTE — Progress Notes (Signed)
 Spoke w/ via phone for pre-op interview--- Marissa Andersen needs dos----  UPT per anesthesia. Surgeon orders requested 10/27/23.       Andersen results------ COVID test -----patient states asymptomatic no test needed Arrive at -------0530 NPO after MN NO Solid Food.   Pre-Surgery Ensure or G2:  Med rec completed Medications to take morning of surgery ----- Flexeril PRN Diabetic medication -----  GLP1 agonist last dose: GLP1 instructions:  Patient instructed no nail polish to be worn day of surgery Patient instructed to bring photo id and insurance card day of surgery Patient aware to have Driver (ride ) / caregiver    for 24 hours after surgery - Husband Marissa Andersen Patient Special Instructions ----- Pre-Op special Instructions -----  Patient verbalized understanding of instructions that were given at this phone interview. Patient denies chest pain, sob, fever, cough at the interview.

## 2023-10-28 ENCOUNTER — Ambulatory Visit: Payer: Self-pay | Admitting: Neurology

## 2023-10-30 DIAGNOSIS — F411 Generalized anxiety disorder: Secondary | ICD-10-CM | POA: Diagnosis not present

## 2023-11-02 ENCOUNTER — Other Ambulatory Visit: Payer: Self-pay | Admitting: Obstetrics and Gynecology

## 2023-11-02 DIAGNOSIS — N83201 Unspecified ovarian cyst, right side: Secondary | ICD-10-CM

## 2023-11-02 NOTE — H&P (Signed)
 Reason for Appointment   1.  Preop visit/ complex right ovarian cyst History of Present Illness    General:  38 y/o presents for pre-op visit. Pt is schedule for robotic assisted laparoscopic right ovarian cystectomy possible right oophorectomy on 11/03/2023 for the management of complex ovarian cyst.   HISTORY OF PRESENT ILLNESS: She reports experiencing intermittent pain, which is currently present. She has not been taking birth control pills due to concerns about potential side effects, including bleeding, as informed by her pharmacist. She expresses apprehension about undergoing surgery, citing her sister's experience with laparoscopic surgery for endometriosis, which resulted in bowel complications and required additional surgery.  On ultrasound today 07/31/ 2025 the right ovarian cyst has increased in size from 5.4 cm in March to 7.7 cm. The previous left ovarian cyst has resolved.  Current Medications Taking Ocrevus (Ocrelizumab ) 300 MG/10ML Solution as directed Intravenous Once every 6 months , Notes to Pharmacist: per neurology Cyclobenzaprine HCl 10 MG Tablet 1 tablet at bedtime as needed Orally once a day if needed Ibuprofen  800 MG Tablet 1 tablet with food or milk as needed Orally every 8 hrs as needed Medication List reviewed and reconciled with the patient Past Medical History MS dx in 2019 ( [employer], [employer]). HTN (saw cardiology in 2024 with palpitations). Anxiety (has tried Celexa, Lexapro and Zoloft). Migraine headaches. 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 Acne: derm. palpitations (cards eval and ZIO monitor May 2024). Surgical History [facility]  07/18/2016 right breast lumpectomy- benign 12/06/2020 cervical cerclage 2017 Family History Father: alive, HTN, MI Mother: alive, HTN, diabetes Paternal Grand Father: alive, diagnosed with Hypertension Paternal Grand Mother: alive, diagnosed with Hypertension Maternal Grand Father: deceased,  diabetes Maternal Grand Mother: alive, diabetes Brother 1: alive Sister 1: alive 1 brother(s) , 1 sister(s) - healthy. 1 son(s) , 1 daughter(s) - healthy. No family history of colon, ovarian or breast cancer. Social History    General:  Tobacco use cigarettes: Never smoked, Tobacco history last updated 10/21/2023, Vaping No. EXPOSURE TO PASSIVE SMOKE: no. Alcohol: yes, social. Caffeine: no, no. Recreational drug use: no. Marital Status: married. Children: 1 son Glenetta), 1 daughter (Zuri). OCCUPATION: employed, stay at home mom. Lives with parents and son. Gyn History Sexual activity currently sexually active.  Periods : every month.  LMP 10/03/2023.  Birth control vasectomy.  Last pap smear date 01/31/2022- negative.  Last mammogram date 09/21/2020- benign.  H/O Abnormal pap smear assessed with colposcopy, [facility].  H/O STD Chlamydia, TRICH, HPV.  Menarche 13.  Denies H/O Trying to get pregnant.  OB History Number of pregnancies  2.  Pregnancy # 1  live birth, vaginal delivery, week gestation-24 weeks, boy.  Pregnancy # 2  03/12/2016, live birth, vaginal delivery, girl 5lbs 7oz.  Allergies Losartan Potassium: anxiety - Side Effects Hospitalization/Major Diagnostic Procedure childbirth at 24 weeks 02/16/10 childbirth Review of Systems    CONSTITUTIONAL:  Chills No. Fatigue No. Fever No. Night sweats No. Recent travel outside [place]  No. Sweats No. Weight change No.     OPHTHALMOLOGY:  Blurring of vision no. Change in vision no. Double vision no.     ENT:  Dizziness no. Nose bleeds no. Sore throat no. Teeth pain no.     ALLERGY:  Hives no.     CARDIOLOGY:  Chest pain no. High blood pressure no. Irregular heart beat no. Leg edema no. Palpitations no.     RESPIRATORY:  Shortness of breath no. Cough no. Wheezing no.     UROLOGY:  Pain with urination no. Urinary urgency no. Urinary frequency no. Urinary incontinence no. Difficulty urinating No. Blood in urine No.     GASTROENTEROLOGY:   Abdominal pain no. Appetite change no. Bloating/belching no. Blood in stool or on toilet paper no. Change in bowel movements no. Constipation no. Diarrhea no. Difficulty swallowing no. Nausea no.     FEMALE REPRODUCTIVE:  Vulvar pain no. Vulvar rash no. Abnormal vaginal bleeding no. Breast pain no. Nipple discharge no. Pain with intercourse no. Pelvic pain no. Unusual vaginal discharge no. Vaginal itching no.     MUSCULOSKELETAL:  Muscle aches no.     NEUROLOGY:  Headache no. Tingling/numbness no. Weakness no.     PSYCHOLOGY:  Depression no. Anxiety no. Nervousness no. Sleep disturbances no. Suicidal ideation no .     ENDOCRINOLOGY:  Excessive thirst no. Excessive urination no. Hair loss no. Heat or cold intolerance no.     HEMATOLOGY/LYMPH:  Abnormal bleeding no. Easy bruising no. Swollen glands no.     DERMATOLOGY:  New/changing skin lesion no. Rash no. Sores no.  Vital Signs Wt: 157.8, Wt change: 0.4 lbs, Ht: 62, BMI: 28.86, Pulse sitting: 73, BP sitting: 121/83. Examination    General Examination: CONSTITUTIONAL: alert, oriented, NAD.  SKIN:  moist, warm.  EYES:  Conjunctiva clear.  LUNGS: good I:E efffort noted, clear to auscultation bilaterally.  HEART: regular rate and rhythm.  ABDOMEN: soft, non-tender/non-distended, bowel sounds present.  FEMALE GENITOURINARY: normal external genitalia, labia - unremarkable, vagina - pink moist mucosa, no lesions or abnormal discharge, cervix - no discharge or lesions or CMT, adnexa - no masses or tenderness, uterus - nontender and normal size on palpation.  EXTREMITIES: no edema present.  PSYCH:  affect normal, good eye contact.  Physical Examination    Chaperone present:  Chaperone present Anderson, [place] 10/21/2023 10:52:31 AM >, for pelvic exam.  Assessments 1. Complex ovarian cyst - N83.299 (Primary)    Treatment 1. Complex ovarian cyst        Notes: Planning robotic assissted laparoscopic right ovarian cystectomy possible right  oophorectomy. The risks associated with the surgery, including infection, bleeding, damage to the bowel or ureters, and the potential need for further surgery, were thoroughly explained. The possibility of converting to a larger incision due to excessive scar tissue was also discussed. The patient was informed that she would likely have 3 to four small incisions, each less than a centimeter in size. Postoperative restrictions include no lifting over 10 pounds for 2 weeks, no sexual activity for 2 weeks, and no driving for 3 to 5 days or until she is off narcotics. She was instructed to fast from midnight the night before the surgery and to avoid aspirin, Aleve, ibuprofen , and Motrin  until after the surgery. Tylenol  can be used for pain management if needed.

## 2023-11-02 NOTE — H&P (Signed)
   The note originally documented on this encounter has been moved the the encounter in which it belongs.

## 2023-11-03 ENCOUNTER — Other Ambulatory Visit: Payer: Self-pay

## 2023-11-03 ENCOUNTER — Ambulatory Visit (HOSPITAL_COMMUNITY)

## 2023-11-03 ENCOUNTER — Encounter (HOSPITAL_COMMUNITY)
Admission: RE | Disposition: A | Discharge status: Home / Self Care | Payer: Self-pay | Source: Home / Self Care | Attending: Obstetrics and Gynecology

## 2023-11-03 ENCOUNTER — Encounter (HOSPITAL_COMMUNITY): Payer: Self-pay | Admitting: Obstetrics and Gynecology

## 2023-11-03 ENCOUNTER — Ambulatory Visit (HOSPITAL_COMMUNITY)
Admission: RE | Admit: 2023-11-03 | Discharge: 2023-11-03 | Disposition: A | Discharge status: Home / Self Care | Attending: Obstetrics and Gynecology | Admitting: Obstetrics and Gynecology

## 2023-11-03 DIAGNOSIS — N80101 Endometriosis of right ovary, unspecified depth: Secondary | ICD-10-CM | POA: Diagnosis not present

## 2023-11-03 DIAGNOSIS — G35 Multiple sclerosis: Secondary | ICD-10-CM | POA: Diagnosis not present

## 2023-11-03 DIAGNOSIS — N83201 Unspecified ovarian cyst, right side: Secondary | ICD-10-CM | POA: Diagnosis not present

## 2023-11-03 DIAGNOSIS — F419 Anxiety disorder, unspecified: Secondary | ICD-10-CM | POA: Diagnosis not present

## 2023-11-03 DIAGNOSIS — N83299 Other ovarian cyst, unspecified side: Secondary | ICD-10-CM | POA: Diagnosis not present

## 2023-11-03 DIAGNOSIS — Z01818 Encounter for other preprocedural examination: Secondary | ICD-10-CM

## 2023-11-03 HISTORY — PX: ROBOTIC ASSISTED LAPAROSCOPIC OVARIAN CYSTECTOMY: SHX6081

## 2023-11-03 LAB — CBC
HCT: 45.3 % (ref 36.0–46.0)
Hemoglobin: 13.7 g/dL (ref 12.0–15.0)
MCH: 27.6 pg (ref 26.0–34.0)
MCHC: 30.2 g/dL (ref 30.0–36.0)
MCV: 91.3 fL (ref 80.0–100.0)
Platelets: 240 K/uL (ref 150–400)
RBC: 4.96 MIL/uL (ref 3.87–5.11)
RDW: 12.7 % (ref 11.5–15.5)
WBC: 5.2 K/uL (ref 4.0–10.5)
nRBC: 0 % (ref 0.0–0.2)

## 2023-11-03 LAB — TYPE AND SCREEN
ABO/RH(D): O POS
Antibody Screen: NEGATIVE

## 2023-11-03 LAB — ABO/RH: ABO/RH(D): O POS

## 2023-11-03 LAB — POCT PREGNANCY, URINE: Preg Test, Ur: NEGATIVE

## 2023-11-03 SURGERY — EXCISION, CYST, OVARY, ROBOT-ASSISTED, LAPAROSCOPIC
Anesthesia: General | Site: Pelvis | Laterality: Right

## 2023-11-03 MED ORDER — OXYCODONE HCL 5 MG PO TABS
ORAL_TABLET | ORAL | Status: AC
  Filled 2023-11-03: qty 1

## 2023-11-03 MED ORDER — PROPOFOL 10 MG/ML IV BOLUS
INTRAVENOUS | Status: AC
  Filled 2023-11-03: qty 20

## 2023-11-03 MED ORDER — ORAL CARE MOUTH RINSE: 15.0000 mL | Freq: Once | OROMUCOSAL | Status: AC

## 2023-11-03 MED ORDER — CHLORHEXIDINE GLUCONATE 0.12 % MT SOLN
15.0000 mL | Freq: Once | OROMUCOSAL | Status: AC
  Administered 2023-11-03: 15 mL via OROMUCOSAL

## 2023-11-03 MED ORDER — FENTANYL CITRATE (PF) 250 MCG/5ML IJ SOLN
INTRAMUSCULAR | Status: AC
  Filled 2023-11-03: qty 5

## 2023-11-03 MED ORDER — FENTANYL CITRATE (PF) 100 MCG/2ML IJ SOLN: 25.0000 ug | INTRAMUSCULAR | Status: DC | PRN

## 2023-11-03 MED ORDER — LIDOCAINE 2% (20 MG/ML) 5 ML SYRINGE
INTRAMUSCULAR | Status: DC | PRN
  Administered 2023-11-03: 60 mg via INTRAVENOUS

## 2023-11-03 MED ORDER — ACETAMINOPHEN 500 MG PO TABS
ORAL_TABLET | ORAL | Status: AC
  Filled 2023-11-03: qty 2

## 2023-11-03 MED ORDER — PROPOFOL 10 MG/ML IV BOLUS
INTRAVENOUS | Status: DC | PRN
  Administered 2023-11-03: 200 mg via INTRAVENOUS

## 2023-11-03 MED ORDER — OXYCODONE HCL 5 MG PO TABS
5.0000 mg | ORAL_TABLET | Freq: Once | ORAL | Status: AC | PRN
  Administered 2023-11-03: 5 mg via ORAL

## 2023-11-03 MED ORDER — DEXAMETHASONE SODIUM PHOSPHATE 10 MG/ML IJ SOLN
INTRAMUSCULAR | Status: DC | PRN
Start: 2023-11-03 — End: 2023-11-03
  Administered 2023-11-03: 10 mg via INTRAVENOUS

## 2023-11-03 MED ORDER — OXYCODONE HCL 5 MG PO TABS: 5.0000 mg | ORAL_TABLET | Freq: Four times a day (QID) | ORAL | 0 refills | Status: AC | PRN

## 2023-11-03 MED ORDER — ROPIVACAINE HCL 5 MG/ML IJ SOLN
INTRAMUSCULAR | Status: AC
  Filled 2023-11-03: qty 60

## 2023-11-03 MED ORDER — PHENYLEPHRINE 80 MCG/ML (10ML) SYRINGE FOR IV PUSH (FOR BLOOD PRESSURE SUPPORT)
PREFILLED_SYRINGE | INTRAVENOUS | Status: DC | PRN
  Administered 2023-11-03 (×4): 80 ug via INTRAVENOUS

## 2023-11-03 MED ORDER — SODIUM CHLORIDE 0.9 % IV SOLN: INTRAVENOUS | Status: DC | PRN

## 2023-11-03 MED ORDER — SODIUM CHLORIDE (PF) 0.9 % IJ SOLN
INTRAMUSCULAR | Status: AC
  Filled 2023-11-03: qty 100

## 2023-11-03 MED ORDER — BUPIVACAINE HCL (PF) 0.25 % IJ SOLN
INTRAMUSCULAR | Status: AC
  Filled 2023-11-03: qty 60

## 2023-11-03 MED ORDER — SUGAMMADEX SODIUM 200 MG/2ML IV SOLN
INTRAVENOUS | Status: DC | PRN
  Administered 2023-11-03: 150 mg via INTRAVENOUS

## 2023-11-03 MED ORDER — SCOPOLAMINE 1 MG/3DAYS TD PT72
MEDICATED_PATCH | TRANSDERMAL | Status: AC
  Filled 2023-11-03: qty 1

## 2023-11-03 MED ORDER — 0.9 % SODIUM CHLORIDE (POUR BTL) OPTIME
TOPICAL | Status: DC | PRN
Start: 2023-11-03 — End: 2023-11-03
  Administered 2023-11-03: 1000 mL

## 2023-11-03 MED ORDER — BUPIVACAINE LIPOSOME 1.3 % IJ SUSP
INTRAMUSCULAR | Status: AC
  Filled 2023-11-03: qty 20

## 2023-11-03 MED ORDER — LACTATED RINGERS IV SOLN: INTRAVENOUS | Status: DC

## 2023-11-03 MED ORDER — GABAPENTIN 300 MG PO CAPS
300.0000 mg | ORAL_CAPSULE | ORAL | Status: AC
  Administered 2023-11-03: 300 mg via ORAL

## 2023-11-03 MED ORDER — BUPIVACAINE LIPOSOME 1.3 % IJ SUSP
INTRAMUSCULAR | Status: DC | PRN
  Administered 2023-11-03: 50 mL

## 2023-11-03 MED ORDER — ACETAMINOPHEN 500 MG PO TABS: 1000.0000 mg | ORAL_TABLET | Freq: Three times a day (TID) | ORAL | 0 refills | Status: AC | PRN

## 2023-11-03 MED ORDER — SODIUM CHLORIDE 0.9 % IV SOLN
2.0000 g | INTRAVENOUS | Status: AC
  Administered 2023-11-03: 2 g via INTRAVENOUS
  Filled 2023-11-03: qty 2

## 2023-11-03 MED ORDER — KETOROLAC TROMETHAMINE 30 MG/ML IJ SOLN
INTRAMUSCULAR | Status: DC | PRN
  Administered 2023-11-03: 30 mg via INTRAVENOUS

## 2023-11-03 MED ORDER — LIDOCAINE 2% (20 MG/ML) 5 ML SYRINGE
INTRAMUSCULAR | Status: AC
  Filled 2023-11-03: qty 5

## 2023-11-03 MED ORDER — ONDANSETRON HCL 4 MG/2ML IJ SOLN
INTRAMUSCULAR | Status: AC
  Filled 2023-11-03: qty 2

## 2023-11-03 MED ORDER — AMISULPRIDE (ANTIEMETIC) 5 MG/2ML IV SOLN: 10.0000 mg | Freq: Once | INTRAVENOUS | Status: DC | PRN

## 2023-11-03 MED ORDER — SCOPOLAMINE 1 MG/3DAYS TD PT72
1.0000 | MEDICATED_PATCH | TRANSDERMAL | Status: DC
  Administered 2023-11-03: 1 mg via TRANSDERMAL

## 2023-11-03 MED ORDER — ONDANSETRON HCL 4 MG/2ML IJ SOLN
INTRAMUSCULAR | Status: DC | PRN
Start: 2023-11-03 — End: 2023-11-03
  Administered 2023-11-03: 4 mg via INTRAVENOUS

## 2023-11-03 MED ORDER — ROCURONIUM BROMIDE 10 MG/ML (PF) SYRINGE
PREFILLED_SYRINGE | INTRAVENOUS | Status: DC | PRN
  Administered 2023-11-03: 50 mg via INTRAVENOUS
  Administered 2023-11-03: 20 mg via INTRAVENOUS

## 2023-11-03 MED ORDER — KETOROLAC TROMETHAMINE 30 MG/ML IJ SOLN: 30.0000 mg | Freq: Once | INTRAMUSCULAR | Status: DC | PRN

## 2023-11-03 MED ORDER — IBUPROFEN 800 MG PO TABS: 800.0000 mg | ORAL_TABLET | Freq: Three times a day (TID) | ORAL | 0 refills | Status: AC | PRN

## 2023-11-03 MED ORDER — OXYCODONE HCL 5 MG/5ML PO SOLN: 5.0000 mg | Freq: Once | ORAL | Status: AC | PRN

## 2023-11-03 MED ORDER — CHLORHEXIDINE GLUCONATE 0.12 % MT SOLN
OROMUCOSAL | Status: AC
  Filled 2023-11-03: qty 15

## 2023-11-03 MED ORDER — HEMOSTATIC AGENTS (NO CHARGE) OPTIME
TOPICAL | Status: DC | PRN
  Administered 2023-11-03: 1 via TOPICAL

## 2023-11-03 MED ORDER — GABAPENTIN 300 MG PO CAPS
ORAL_CAPSULE | ORAL | Status: AC
  Filled 2023-11-03: qty 1

## 2023-11-03 MED ORDER — MIDAZOLAM HCL 2 MG/2ML IJ SOLN
INTRAMUSCULAR | Status: AC
  Filled 2023-11-03: qty 2

## 2023-11-03 MED ORDER — MIDAZOLAM HCL 2 MG/2ML IJ SOLN
INTRAMUSCULAR | Status: DC | PRN
  Administered 2023-11-03: 2 mg via INTRAVENOUS

## 2023-11-03 MED ORDER — SODIUM CHLORIDE 0.9 % IR SOLN
Status: DC | PRN
  Administered 2023-11-03: 1000 mL

## 2023-11-03 MED ORDER — ROCURONIUM BROMIDE 10 MG/ML (PF) SYRINGE
PREFILLED_SYRINGE | INTRAVENOUS | Status: AC
  Filled 2023-11-03: qty 10

## 2023-11-03 MED ORDER — ACETAMINOPHEN 500 MG PO TABS
1000.0000 mg | ORAL_TABLET | ORAL | Status: AC
  Administered 2023-11-03: 1000 mg via ORAL

## 2023-11-03 MED ORDER — DEXAMETHASONE SODIUM PHOSPHATE 10 MG/ML IJ SOLN
INTRAMUSCULAR | Status: AC
  Filled 2023-11-03: qty 1

## 2023-11-03 MED ORDER — METHYLENE BLUE 20 MG/2ML IV SOSY
PREFILLED_SYRINGE | INTRAVENOUS | Status: AC
  Filled 2023-11-03: qty 2

## 2023-11-03 MED ORDER — POVIDONE-IODINE 10 % EX SWAB: 2.0000 | Freq: Once | CUTANEOUS | Status: DC

## 2023-11-03 MED ORDER — FENTANYL CITRATE (PF) 250 MCG/5ML IJ SOLN
INTRAMUSCULAR | Status: DC | PRN
  Administered 2023-11-03: 50 ug via INTRAVENOUS
  Administered 2023-11-03: 100 ug via INTRAVENOUS

## 2023-11-03 SURGICAL SUPPLY — 56 items
BARRIER ADHS 3X4 INTERCEED (GAUZE/BANDAGES/DRESSINGS) IMPLANT
CANNULA CAP OBTURATR AIRSEAL 8 (CAP) ×2 IMPLANT
CELLS DAT CNTRL 66122 CELL SVR (MISCELLANEOUS) IMPLANT
COVER BACK TABLE 60X90IN (DRAPES) ×2 IMPLANT
COVER TIP SHEARS 8 DVNC (MISCELLANEOUS) ×2 IMPLANT
DEFOGGER SCOPE WARM SEASHARP (MISCELLANEOUS) ×2 IMPLANT
DERMABOND ADVANCED .7 DNX12 (GAUZE/BANDAGES/DRESSINGS) ×2 IMPLANT
DILATOR CANAL MILEX (MISCELLANEOUS) ×2 IMPLANT
DRAPE ARM DVNC X/XI (DISPOSABLE) ×8 IMPLANT
DRAPE COLUMN DVNC XI (DISPOSABLE) ×2 IMPLANT
DRAPE SURG IRRIG POUCH 19X23 (DRAPES) ×2 IMPLANT
DRAPE UTILITY 15X26 TOWEL STRL (DRAPES) ×2 IMPLANT
DRIVER NDL MEGA 8 DVNC XI (INSTRUMENTS) ×2 IMPLANT
DRIVER NDLE MEGA DVNC XI (INSTRUMENTS) ×1 IMPLANT
DURAPREP 26ML APPLICATOR (WOUND CARE) ×2 IMPLANT
ELECTRODE REM PT RTRN 9FT ADLT (ELECTROSURGICAL) ×2 IMPLANT
FORCEPS BPLR LNG DVNC XI (INSTRUMENTS) ×2 IMPLANT
FORCEPS PROGRASP DVNC XI (FORCEP) IMPLANT
GAUZE 4X4 16PLY ~~LOC~~+RFID DBL (SPONGE) IMPLANT
GLOVE BIOGEL M 6.5 STRL (GLOVE) ×6 IMPLANT
GLOVE BIOGEL PI IND STRL 6.5 (GLOVE) ×6 IMPLANT
GRASPER COBRA DVNC RU (INSTRUMENTS) ×2 IMPLANT
HIBICLENS CHG 4% 4OZ BTL (MISCELLANEOUS) ×4 IMPLANT
IRRIGATION STRYKERFLOW (MISCELLANEOUS) ×2 IMPLANT
KIT PINK PAD W/HEAD ARM REST (MISCELLANEOUS) ×2 IMPLANT
LEGGING LITHOTOMY PAIR STRL (DRAPES) ×2 IMPLANT
MANIPULATOR ADVINCU DEL 2.5 PL (MISCELLANEOUS) IMPLANT
MANIPULATOR ADVINCU DEL 3.0 PL (MISCELLANEOUS) IMPLANT
MANIPULATOR ADVINCU DEL 3.5 PL (MISCELLANEOUS) IMPLANT
MANIPULATOR ADVINCU DEL 4.0 PL (MISCELLANEOUS) IMPLANT
OBTURATOR OPTICALSTD 8 DVNC (TROCAR) ×2 IMPLANT
OCCLUDER COLPOPNEUMO (BALLOONS) IMPLANT
PACK ROBOT WH (CUSTOM PROCEDURE TRAY) ×2 IMPLANT
PACK ROBOTIC GOWN (GOWN DISPOSABLE) ×2 IMPLANT
PAD OB MATERNITY 11 LF (PERSONAL CARE ITEMS) ×2 IMPLANT
POWDER SURGICEL 3.0 GRAM (HEMOSTASIS) IMPLANT
RETRACTOR WND ALEXIS 18 MED (MISCELLANEOUS) IMPLANT
SCISSORS LAP 5X45 EPIX DISP (ENDOMECHANICALS) ×2 IMPLANT
SCISSORS MNPLR CVD DVNC XI (INSTRUMENTS) ×2 IMPLANT
SEAL UNIV 5-12 XI (MISCELLANEOUS) ×4 IMPLANT
SEALER VESSEL EXT DVNC XI (MISCELLANEOUS) IMPLANT
SET IRRIG Y TYPE TUR BLADDER L (SET/KITS/TRAYS/PACK) IMPLANT
SET TRI-LUMEN FLTR TB AIRSEAL (TUBING) IMPLANT
SET TUBE FILTERED XL AIRSEAL (SET/KITS/TRAYS/PACK) ×2 IMPLANT
SPIKE FLUID TRANSFER (MISCELLANEOUS) ×4 IMPLANT
SUT VIC AB 0 CT1 27XBRD ANBCTR (SUTURE) ×4 IMPLANT
SUT VICRYL 0 UR6 27IN ABS (SUTURE) IMPLANT
SUT VICRYL RAPIDE 4/0 PS 2 (SUTURE) ×4 IMPLANT
SUT VLOC 180 0 9IN GS21 (SUTURE) ×2 IMPLANT
SYSTEM RETRIEVL 5MM INZII UNIV (BASKET) IMPLANT
TIP ENDOSCOPIC SURGICEL (TIP) IMPLANT
TOWEL GREEN STERILE (TOWEL DISPOSABLE) ×2 IMPLANT
TRAY FOLEY W/BAG SLVR 14FR (SET/KITS/TRAYS/PACK) IMPLANT
TROCAR PORT AIRSEAL 8X120 (TROCAR) IMPLANT
UNDERPAD 30X36 HEAVY ABSORB (UNDERPADS AND DIAPERS) ×2 IMPLANT
WATER STERILE IRR 1000ML POUR (IV SOLUTION) ×2 IMPLANT

## 2023-11-03 NOTE — Transfer of Care (Signed)
 Immediate Anesthesia Transfer of Care Note  Patient: Marissa Andersen  Procedure(s) Performed: EXCISION, CYST, OVARY, ROBOT-ASSISTED, LAPAROSCOPIC (Right: Pelvis)  Patient Location: PACU  Anesthesia Type:General  Level of Consciousness: awake and sedated  Airway & Oxygen Therapy: Patient Spontanous Breathing and Patient connected to face mask oxygen  Post-op Assessment: Report given to RN and Post -op Vital signs reviewed and stable  Post vital signs: Reviewed and stable  Last Vitals:  Vitals Value Taken Time  BP 116/65 11/03/23 09:46  Temp 36.4 C 11/03/23 09:46  Pulse 72 11/03/23 09:49  Resp 16 11/03/23 09:49  SpO2 100 % 11/03/23 09:49  Vitals shown include unfiled device data.  Last Pain:  Vitals:   11/03/23 0654  TempSrc: Oral  PainSc: 2       Patients Stated Pain Goal: 3 (11/03/23 0654)  Complications: No notable events documented.

## 2023-11-03 NOTE — H&P (Signed)
 Date of Initial H&P: 11/03/23  History reviewed, patient examined, no change in status, stable for surgery.

## 2023-11-03 NOTE — Anesthesia Preprocedure Evaluation (Addendum)
 Anesthesia Evaluation  Patient identified by MRN, date of birth, ID band Patient awake    Reviewed: Allergy & Precautions, NPO status , Patient's Chart, lab work & pertinent test results  Airway Mallampati: I  TM Distance: >3 FB Neck ROM: Full    Dental no notable dental hx.    Pulmonary neg pulmonary ROS   Pulmonary exam normal        Cardiovascular negative cardio ROS Normal cardiovascular exam     Neuro/Psych  Headaches  Anxiety     Multiple sclerosis    GI/Hepatic negative GI ROS, Neg liver ROS,,,  Endo/Other  negative endocrine ROS    Renal/GU negative Renal ROS     Musculoskeletal negative musculoskeletal ROS (+)    Abdominal   Peds  Hematology negative hematology ROS (+)   Anesthesia Other Findings Complex Ovarian Cyst  Reproductive/Obstetrics                              Anesthesia Physical Anesthesia Plan  ASA: 2  Anesthesia Plan: General   Post-op Pain Management:    Induction: Intravenous  PONV Risk Score and Plan: 4 or greater and Ondansetron , Dexamethasone , Midazolam , Scopolamine  patch - Pre-op and Treatment may vary due to age or medical condition  Airway Management Planned: Oral ETT  Additional Equipment:   Intra-op Plan:   Post-operative Plan: Extubation in OR  Informed Consent: I have reviewed the patients History and Physical, chart, labs and discussed the procedure including the risks, benefits and alternatives for the proposed anesthesia with the patient or authorized representative who has indicated his/her understanding and acceptance.     Dental advisory given  Plan Discussed with: CRNA  Anesthesia Plan Comments:          Anesthesia Quick Evaluation

## 2023-11-03 NOTE — Discharge Instructions (Signed)
 Post Anesthesia Home Care Instructions  Activity: Get plenty of rest for the remainder of the day. A responsible individual must stay with you for 24 hours following the procedure.  For the next 24 hours, DO NOT: -Drive a car -Advertising copywriter -Drink alcoholic beverages -Take any medication unless instructed by your physician -Make any legal decisions or sign important papers.  Meals: Start with liquid foods such as gelatin or soup. Progress to regular foods as tolerated. Avoid greasy, spicy, heavy foods. If nausea and/or vomiting occur, drink only clear liquids until the nausea and/or vomiting subsides. Call your physician if vomiting continues.  Special Instructions/Symptoms: Your throat may feel dry or sore from the anesthesia or the breathing tube placed in your throat during surgery. If this causes discomfort, gargle with warm salt water. The discomfort should disappear within 24 hours.  If you had a scopolamine  patch placed behind your ear for the management of post- operative nausea and/or vomiting:  1. The medication in the patch is effective for 72 hours, after which it should be removed.  Wrap patch in a tissue and discard in the trash. Wash hands thoroughly with soap and water. 2. You may remove the patch earlier than 72 hours if you experience unpleasant side effects which may include dry mouth, dizziness or visual disturbances. 3. Avoid touching the patch. Wash your hands with soap and water after contact with the patch.    Remove patch behind right ear by Friday, November 06, 2023.  Information for Discharge Teaching: EXPAREL  (bupivacaine  liposome injectable suspension)   Pain relief is important to your recovery. The goal is to control your pain so you can move easier and return to your normal activities as soon as possible after your procedure. Your physician may use several types of medicines to manage pain, swelling, and more.  Your surgeon or anesthesiologist  gave you EXPAREL (bupivacaine ) to help control your pain after surgery.  EXPAREL  is a local anesthetic designed to release slowly over an extended period of time to provide pain relief by numbing the tissue around the surgical site. EXPAREL  is designed to release pain medication over time and can control pain for up to 72 hours. Depending on how you respond to EXPAREL , you may require less pain medication during your recovery. EXPAREL  can help reduce or eliminate the need for opioids during the first few days after surgery when pain relief is needed the most. EXPAREL  is not an opioid and is not addictive. It does not cause sleepiness or sedation.   Important! A teal colored band has been placed on your arm with the date, time and amount of EXPAREL  you have received. Please leave this armband in place for the full 96 hours following administration, and then you may remove the band. If you return to the hospital for any reason within 96 hours following the administration of EXPAREL , the armband provides important information that your health care providers to know, and alerts them that you have received this anesthetic.    Possible side effects of EXPAREL : Temporary loss of sensation or ability to move in the area where medication was injected. Nausea, vomiting, constipation Rarely, numbness and tingling in your mouth or lips, lightheadedness, or anxiety may occur. Call your doctor right away if you think you may be experiencing any of these sensations, or if you have other questions regarding possible side effects.  Follow all other discharge instructions given to you by your surgeon or nurse. Eat a healthy diet and drink plenty  of water or other fluids.  Do not remove green armband until Saturday, November 07, 2023.

## 2023-11-03 NOTE — Anesthesia Procedure Notes (Signed)
 Procedure Name: Intubation Date/Time: 11/03/2023 7:55 AM  Performed by: Kona Yusuf C, CRNAPre-anesthesia Checklist: Patient identified, Emergency Drugs available, Suction available and Patient being monitored Patient Re-evaluated:Patient Re-evaluated prior to induction Oxygen Delivery Method: Circle system utilized Preoxygenation: Pre-oxygenation with 100% oxygen Induction Type: IV induction Ventilation: Mask ventilation without difficulty Laryngoscope Size: Mac and 3 Tube type: Oral Tube size: 7.0 mm Number of attempts: 1 Airway Equipment and Method: Stylet and Oral airway Placement Confirmation: ETT inserted through vocal cords under direct vision, positive ETCO2 and breath sounds checked- equal and bilateral Secured at: 21 cm Tube secured with: Tape Dental Injury: Teeth and Oropharynx as per pre-operative assessment

## 2023-11-03 NOTE — Op Note (Signed)
 11/03/2023  9:39 AM  PATIENT:  Marissa Andersen  38 y.o. female  PRE-OPERATIVE DIAGNOSIS:  Complex Ovarian Cyst  POST-OPERATIVE DIAGNOSIS:  Complex Ovarian Cyst  PROCEDURE:  Procedure(s): EXCISION, CYST, OVARY, ROBOT-ASSISTED, LAPAROSCOPIC (Right)  SURGEON:  Surgeons and Role:    DEWAINE Rosalva Sawyer, MD - Primary  PHYSICIAN ASSISTANT:   ASSISTANTS: Megan RNFA An experienced assistant was required given the standard of surgical care given the complexity of the case.  This assistant was needed for exposure, dissection, suctioning, retraction, instrument exchange, assisting with delivery with administration of fundal pressure, and for overall help during the procedure.    ANESTHESIA:   general  EBL:  50 mL   BLOOD ADMINISTERED:none  DRAINS: Urinary Catheter (Foley)   LOCAL MEDICATIONS USED:  OTHER exparel  with marcaine    SPECIMEN:  Source of Specimen:  Right ovarian cyst   DISPOSITION OF SPECIMEN:  PATHOLOGY  COUNTS:  YES  TOURNIQUET:  * No tourniquets in log *  DICTATION: .Note written in EPIC  PLAN OF CARE: Discharge to home after PACU  PATIENT DISPOSITION:  PACU - hemodynamically stable.   Delay start of Pharmacological VTE agent (>24hrs) due to surgical blood loss or risk of bleeding: not applicable  Findings: Complex Right ovarian cyst approximately 8 cm in size with appearance of endometrioma. Endometrial implants noted in the posterior culdesac. Normal appearing fallopian tubes bilaterally. Normal appearing Uterus.   Procedure: The patient was taken to the operating room #6 at [facility] where she was placed under general anesthesia.Time out was performed. Marissa Andersen She was placed in dorsal lithotomy position and prepped and draped in the usual sterile fashion. A vaginal speculum was inserted. The cervix was grasp with a single tooth tenaculum. The uterus sounded to 8 cm. A hulka Uterine manipulater was placed without difficulty. The single tooth tenaculum and speculum  were  removed .  Attention was turned to the patient's abdomen where a 8 mm trocar was placed  at the umbilicus under direct visualization . The pneumoperitoneum was achieved with PCO2 gas.  An 8 mm trocar was placed in the right upper quadrant 8 centimeters from the umbilicus and ater connected to robotic arm #4). An incision was made in the Right upper quadrant TROCAR WAS PLACED 8 cm from the umbilicus. Later connected to robotic arm #2. Attention was turned to the left upper quadrant where a 8 mm midclavicular assistant trocar was placed. ( All incision sites were injected with 10cc of exparel  mixed with marcaine  prior to port placement. )  Once all ports had been placed under direct visualization.The laparoscope was removed and the [employer] robotic system was docked. The robotic arms were connected to the corresponding trocars as listed above. The laparoscope was then reinserted. The long tip bipolar forceps were placed into port #2. The monopolar scissor placed in the port #4. All instruments were directed into the pelvis under direct visualization.  Attention was turned to the surgeons console.. The  Right ovary was incised with the scissors. The capsule of the complex ovarian cyst was noted. During excision of the cyst from the ovary the cyst ruptured and expressed dark brown blood consistent with endometrioma.  The cyst was removed from the ovary with traction and countertraction. Once the cyst was excised it was placed in an endocatch bag and removed through the assistant port.   The pelvis was irrigated. Oozing  was noted from the right ovary. Surgicel powder was placed along the right ovary and posterior  culdesac. Irrigation was performed and Hemostasis was noted.    All instruments removed from the ports.  The robot was undocked. All ports were removed under direct visualization. The pneumoperitoneum was released. The skin incisions were closed with 4-0 Vicryl and then covered with Derma bond.  The Hulka uterine manipulator was removed. Excellent hemostasis was noted.     Sponge lap and needle counts weIre correct x 2. The patient was awakened from anesthesia and taken to the recovery room in stable condition.

## 2023-11-04 ENCOUNTER — Encounter (HOSPITAL_COMMUNITY): Payer: Self-pay | Admitting: Obstetrics and Gynecology

## 2023-11-04 LAB — SURGICAL PATHOLOGY

## 2023-11-04 NOTE — Anesthesia Postprocedure Evaluation (Signed)
 Anesthesia Post Note  Patient: Marissa Andersen  Procedure(s) Performed: EXCISION, CYST, OVARY, ROBOT-ASSISTED, LAPAROSCOPIC (Right: Pelvis)     Patient location during evaluation: PACU Anesthesia Type: General Level of consciousness: awake Pain management: pain level controlled Vital Signs Assessment: post-procedure vital signs reviewed and stable Respiratory status: spontaneous breathing, nonlabored ventilation and respiratory function stable Cardiovascular status: blood pressure returned to baseline and stable Postop Assessment: no apparent nausea or vomiting Anesthetic complications: no   No notable events documented.  Last Vitals:  Vitals:   11/03/23 1100 11/03/23 1115  BP: 120/83 119/85  Pulse:  64  Resp: 15 13  Temp:    SpO2: 99% 100%    Last Pain:  Vitals:   11/03/23 1115  TempSrc:   PainSc: 4                  Samra Pesch P Ladiamond Gallina

## 2023-11-16 DIAGNOSIS — Z9889 Other specified postprocedural states: Secondary | ICD-10-CM | POA: Diagnosis not present

## 2023-11-16 DIAGNOSIS — N809 Endometriosis, unspecified: Secondary | ICD-10-CM | POA: Diagnosis not present

## 2023-11-17 ENCOUNTER — Ambulatory Visit: Admitting: Family Medicine

## 2023-11-18 DIAGNOSIS — F411 Generalized anxiety disorder: Secondary | ICD-10-CM | POA: Diagnosis not present

## 2023-11-19 ENCOUNTER — Telehealth: Payer: Self-pay | Admitting: *Deleted

## 2023-11-19 ENCOUNTER — Encounter: Payer: Self-pay | Admitting: Neurology

## 2023-11-19 NOTE — Telephone Encounter (Signed)
 Per Holly/[facility]:  Marissa Andersen Jul 17, 1985 OVN and Order needs the new updated DX code added   Infusion suite will get order ready for MD Signature and bring to pod.

## 2023-11-23 NOTE — Telephone Encounter (Signed)
 Gave updated order to [facility]

## 2023-11-26 DIAGNOSIS — F411 Generalized anxiety disorder: Secondary | ICD-10-CM | POA: Diagnosis not present

## 2023-12-03 DIAGNOSIS — F411 Generalized anxiety disorder: Secondary | ICD-10-CM | POA: Diagnosis not present

## 2023-12-15 DIAGNOSIS — G35A Relapsing-remitting multiple sclerosis: Secondary | ICD-10-CM | POA: Diagnosis not present

## 2023-12-17 ENCOUNTER — Encounter: Payer: Self-pay | Admitting: Neurology

## 2023-12-17 ENCOUNTER — Telehealth: Payer: Self-pay | Admitting: *Deleted

## 2023-12-17 NOTE — Telephone Encounter (Signed)
 Called pt at [phone]. LVM for pt to call office.   Phone room: if pt calls, please offer appt below and let her know its to discuss changing from Ocrevus  to Briumvi and do needed blood work before change.

## 2023-12-17 NOTE — Telephone Encounter (Signed)
 Fyi.  Pt called returning call Informed Pt Nurse note . Appt Scheduled  , Pt will wait closer to appt  get blood work done

## 2023-12-17 NOTE — Telephone Encounter (Signed)
 Per Dr. Vear: She is on Ocrevus  and has been having infusion reactions with each infusion.  Therefore, I would like to switch her to Briumvi.  She is currently scheduled for April.  If she would like to switch to Briumvi we can move her appointment up to March so that way I can see her in plenty of time to get her approved (and to do blood work)   Held appt for 04/18/24 at 1pm with Dr. Vear.

## 2023-12-25 DIAGNOSIS — F411 Generalized anxiety disorder: Secondary | ICD-10-CM | POA: Diagnosis not present

## 2024-01-08 DIAGNOSIS — F411 Generalized anxiety disorder: Secondary | ICD-10-CM | POA: Diagnosis not present

## 2024-01-20 DIAGNOSIS — F411 Generalized anxiety disorder: Secondary | ICD-10-CM | POA: Diagnosis not present

## 2024-01-22 ENCOUNTER — Emergency Department

## 2024-01-22 ENCOUNTER — Other Ambulatory Visit: Payer: Self-pay

## 2024-01-22 ENCOUNTER — Emergency Department
Admission: EM | Admit: 2024-01-22 | Discharge: 2024-01-22 | Disposition: A | Discharge status: Home / Self Care | Attending: Emergency Medicine | Admitting: Emergency Medicine

## 2024-01-22 DIAGNOSIS — R0602 Shortness of breath: Secondary | ICD-10-CM | POA: Diagnosis not present

## 2024-01-22 DIAGNOSIS — R079 Chest pain, unspecified: Secondary | ICD-10-CM | POA: Diagnosis not present

## 2024-01-22 LAB — BASIC METABOLIC PANEL WITH GFR
Anion gap: 8 (ref 5–15)
BUN: 11 mg/dL (ref 6–20)
CO2: 25 mmol/L (ref 22–32)
Calcium: 8.5 mg/dL — ABNORMAL LOW (ref 8.9–10.3)
Chloride: 105 mmol/L (ref 98–111)
Creatinine, Ser: 0.69 mg/dL (ref 0.44–1.00)
GFR, Estimated: >60 mL/min (ref >60)
Glucose, Bld: 85 mg/dL (ref 70–99)
Potassium: 3.5 mmol/L (ref 3.5–5.1)
Sodium: 138 mmol/L (ref 135–145)

## 2024-01-22 LAB — CBC
HCT: 39 % (ref 36.0–46.0)
Hemoglobin: 12.9 g/dL (ref 12.0–15.0)
MCH: 27 pg (ref 26.0–34.0)
MCHC: 33.1 g/dL (ref 30.0–36.0)
MCV: 81.6 fL (ref 80.0–100.0)
Platelets: 297 K/uL (ref 150–400)
RBC: 4.78 MIL/uL (ref 3.87–5.11)
RDW: 12.4 % (ref 11.5–15.5)
WBC: 4.3 K/uL (ref 4.0–10.5)
nRBC: 0 % (ref 0.0–0.2)

## 2024-01-22 LAB — TROPONIN T, HIGH SENSITIVITY: Troponin T High Sensitivity: <15 ng/L (ref 0–19)

## 2024-01-22 LAB — MAGNESIUM: Magnesium: 2 mg/dL (ref 1.7–2.4)

## 2024-01-22 NOTE — ED Triage Notes (Signed)
 Pt comes in with complaints of chest pain that started last night. Pt complains of pain in the center of her chest that radiates to her left shoulder. Pt has no complaints of pain at this time, but states that the pain comes and goes. She also has moments of dizziness, and shortness of breath, but none at this time.  Pt ambulatory in triage, with no signs of acute distress at this time.

## 2024-01-22 NOTE — ED Provider Notes (Signed)
 [facility] Provider Note    Event Date/Time   First MD Initiated Contact with Patient 01/22/24 [phone]     (approximate)   History   Chest Pain   HPI  Marissa Andersen is a 38 year old female with history of MS presenting to the emergency department for evaluation of chest pain.  Patient reports that last night she had chest pain privately described as a pain in the center of her chest, did have some radiation to her left shoulder.  Has been coming and going.  Episodes last for few seconds up to about a minute.  Does have some associated shortness of breath during the episodes, but resolves afterwards.  Denies history of similar.  No recent cough, congestion.  No reported numbness, tingling, focal weakness.  Father had MI in his 97s.      Physical Exam   Triage Vital Signs: ED Triage Vitals  Encounter Vitals Group     BP 01/22/24 0907 (!) 149/102     Girls Systolic BP Percentile --      Girls Diastolic BP Percentile --      Boys Systolic BP Percentile --      Boys Diastolic BP Percentile --      Pulse Rate 01/22/24 0907 71     Resp 01/22/24 0907 18     Temp 01/22/24 0907 98.5 F (36.9 C)     Temp src --      SpO2 01/22/24 0907 99 %     Weight 01/22/24 0904 150 lb (68 kg)     Height 01/22/24 0904 5' 2 (1.575 m)     Head Circumference --      Peak Flow --      Pain Score 01/22/24 0904 0     Pain Loc --      Pain Education --      Exclude from Growth Chart --     Most recent vital signs: Vitals:   01/22/24 0907 01/22/24 1045  BP: (!) 149/102 (!) 151/94  Pulse: 71 60  Resp: 18 20  Temp: 98.5 F (36.9 C)   SpO2: 99% 100%     General: Awake, interactive  CV:  Good peripheral perfusion Resp:  Unlabored respirations, lungs clear to auscultation Chest wall: Producible tenderness to palpation over the anterior chest wall Abd:  Nondistended.  Neuro:  Symmetric facial movement, fluid speech   ED Results / Procedures / Treatments    Labs (all labs ordered are listed, but only abnormal results are displayed) Labs Reviewed  BASIC METABOLIC PANEL WITH GFR - Abnormal; Notable for the following components:      Result Value   Calcium 8.5 (*)    All other components within normal limits  CBC  MAGNESIUM  POC URINE PREG, ED  TROPONIN T, HIGH SENSITIVITY     EKG EKG independently reviewed and interpreted by myself demonstrates:  EKG demonstrates normal sinus rhythm at a rate of 75, PR 180, QRS 82, QTc 415, no acute ST changes  RADIOLOGY Imaging independently reviewed and interpreted by myself demonstrates:  CXR without focal consolidation  Formal Radiology Read:  DG Chest 2 View Result Date: 01/22/2024 EXAM: 2 VIEW(S) XRAY OF THE CHEST 01/22/2024 09:26:00 AM COMPARISON: Chest radiographs 07/19/2022 and earlier. CLINICAL HISTORY: 38 year old female with chest pain. FINDINGS: LUNGS AND PLEURA: No focal pulmonary opacity. No pleural effusion. No pneumothorax. HEART AND MEDIASTINUM: No acute abnormality of the cardiac and mediastinal silhouettes. BONES AND SOFT TISSUES: Stable small right  lateral chest wall surgical clip. Negative visible bowel gas. No acute osseous abnormality. IMPRESSION: 1. No acute cardiopulmonary abnormality. Electronically signed by: Helayne Hurst MD 01/22/2024 09:47 AM EST RP Workstation: [id number]    PROCEDURES:  Critical Care performed: No  Procedures   MEDICATIONS ORDERED IN ED: Medications - No data to display   IMPRESSION / MDM / ASSESSMENT AND PLAN / ED COURSE  I reviewed the triage vital signs and the nursing notes.  Differential diagnosis includes, but is not limited to ACS, pneumonia, pneumothorax, low risk PE and PERC negative, musculoskeletal strain, stress-mediated physiologic response, arrhythmia  Patient's presentation is most consistent with acute presentation with potential threat to life or bodily function.  Presents with chest pain. Labs, EKG, CXR reassuring. Negative  troponin and greater than 3 hours of symptoms. Low risk HEART score. Low suspicion emergent process.  Patient without acute symptoms on reevaluation.  Do think patient is stable for discharge with close outpatient follow-up. Strict return precautions provided.      FINAL CLINICAL IMPRESSION(S) / ED DIAGNOSES   Final diagnoses:  Nonspecific chest pain     Rx / DC Orders   ED Discharge Orders     None        Note:  This document was prepared using Dragon voice recognition software and may include unintentional dictation errors.   Levander Slate, MD 01/22/24 [phone]

## 2024-01-22 NOTE — Discharge Instructions (Signed)
You were seen in the Emergency Department today for evaluation of your chest pain. Fortunately, your labs, EKG, and chest x- were overall reassuring against a emergency cause for your pain. Please follow-up with your primary doctor within the next few days for reevaluation. You can take Tylenol and ibuprofen as needed for your pain, unless there is another reason that you should not take these. Return to the ER for any new or worsening symptoms including worsening chest pain, difficulty breathing, or any other new or concerning symptoms that you believe warrants immediate attention.   

## 2024-01-27 DIAGNOSIS — F411 Generalized anxiety disorder: Secondary | ICD-10-CM | POA: Diagnosis not present

## 2024-01-28 DIAGNOSIS — F419 Anxiety disorder, unspecified: Secondary | ICD-10-CM | POA: Diagnosis not present

## 2024-01-28 DIAGNOSIS — R030 Elevated blood-pressure reading, without diagnosis of hypertension: Secondary | ICD-10-CM | POA: Diagnosis not present

## 2024-01-28 DIAGNOSIS — R079 Chest pain, unspecified: Secondary | ICD-10-CM | POA: Diagnosis not present

## 2024-01-28 DIAGNOSIS — E7800 Pure hypercholesterolemia, unspecified: Secondary | ICD-10-CM | POA: Diagnosis not present

## 2024-02-04 ENCOUNTER — Other Ambulatory Visit: Payer: Self-pay | Admitting: Neurology

## 2024-02-04 ENCOUNTER — Telehealth: Payer: Self-pay | Admitting: Neurology

## 2024-02-04 DIAGNOSIS — F411 Generalized anxiety disorder: Secondary | ICD-10-CM | POA: Diagnosis not present

## 2024-02-04 MED ORDER — RIZATRIPTAN BENZOATE 5 MG PO TABS: 5.0000 mg | ORAL_TABLET | ORAL | 5 refills | Status: AC | PRN

## 2024-02-04 NOTE — Telephone Encounter (Signed)
 I called pt. She said that she had L sided neck/lower back of head pain yesterday, lying down and sleeping helped.  This morning was ok then went to [facility] (noted had vision blurriness, patchy spots, distorted rainbow colors, choppy) lasted 15-20 min, then headache started R frontal/neck.  She has not taken anything for it.  She had her last Ocrevus  12-15-2023. She does have hx headaches.  She has had L occipital injections last visit 10-2023.

## 2024-02-04 NOTE — Telephone Encounter (Signed)
 Patient said yesterday,having pain in neck and brain stem; today having distorted rainbow vision,. Do not know if related to MS. Would like a call back. Informed patient if not related to MS would need a referral for new symptom. Patient verbalize understand.

## 2024-03-29 ENCOUNTER — Encounter: Admitting: Obstetrics and Gynecology

## 2024-04-18 ENCOUNTER — Ambulatory Visit: Admitting: Neurology

## 2024-05-26 ENCOUNTER — Ambulatory Visit: Admitting: Neurology
